# Patient Record
Sex: Female | Born: 1985 | Race: White | Hispanic: No | Marital: Single | Smoking: Current every day smoker
Health system: Southern US, Community
[De-identification: ages and names within clinical notes are randomized; demographics above are authoritative.]

## PROBLEM LIST (undated history)

## (undated) DIAGNOSIS — F603 Borderline personality disorder: Secondary | ICD-10-CM

## (undated) DIAGNOSIS — F419 Anxiety disorder, unspecified: Secondary | ICD-10-CM

## (undated) DIAGNOSIS — F4310 Post-traumatic stress disorder, unspecified: Secondary | ICD-10-CM

## (undated) DIAGNOSIS — K760 Fatty (change of) liver, not elsewhere classified: Secondary | ICD-10-CM

## (undated) DIAGNOSIS — F259 Schizoaffective disorder, unspecified: Secondary | ICD-10-CM

## (undated) DIAGNOSIS — F319 Bipolar disorder, unspecified: Secondary | ICD-10-CM

---
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
## 2004-05-03 ENCOUNTER — Inpatient Hospital Stay (HOSPITAL_COMMUNITY): Admission: AD | Admit: 2004-05-03 | Discharge: 2004-05-14 | Payer: Self-pay | Admitting: Psychiatry

## 2004-05-17 ENCOUNTER — Emergency Department (HOSPITAL_COMMUNITY): Admission: EM | Admit: 2004-05-17 | Discharge: 2004-05-17 | Payer: Self-pay | Admitting: Emergency Medicine

## 2008-03-23 ENCOUNTER — Ambulatory Visit: Payer: Self-pay | Admitting: Psychiatry

## 2008-03-23 ENCOUNTER — Inpatient Hospital Stay (HOSPITAL_COMMUNITY): Admission: AD | Admit: 2008-03-23 | Discharge: 2008-04-04 | Payer: Self-pay | Admitting: Psychiatry

## 2008-03-29 ENCOUNTER — Encounter: Payer: Self-pay | Admitting: Emergency Medicine

## 2008-04-26 ENCOUNTER — Emergency Department (HOSPITAL_COMMUNITY): Admission: EM | Admit: 2008-04-26 | Discharge: 2008-04-27 | Payer: Self-pay | Admitting: Emergency Medicine

## 2011-05-14 NOTE — H&P (Signed)
Patty Hughes, Patty Hughes               ACCOUNT NO.:  [id number]   MEDICAL RECORD NO.:  [id number]          PATIENT TYPE:  IPS   LOCATION:  0404                          FACILITY:  [facility]   PHYSICIAN:  Anselm Jungling, MD  DATE OF BIRTH:  05-Jul-1986   DATE OF ADMISSION:  03/23/2008  DATE OF DISCHARGE:                       PSYCHIATRIC ADMISSION ASSESSMENT   TIME OF ASSESSMENT:  8:25 a.m.   IDENTIFYING INFORMATION:  A 25 year old white female, single.  This is  an involuntary admission.   HISTORY OF PRESENT ILLNESS:  This is the third [facility] admission for this 89-  year-old who is referred by [facility] in [place],  [place], for psychotic and paranoid behavior.  She has apparently  been homeless for about a week and reports that she has a 54-year-old  child that was removed to the custody of Department of Social Services,  because the patient was unable to care for her.  We also had received a  report that she has been homeless and at one point was in the homeless  shelter in [facility] for about a week.  Her father was in [place],  [place] and had expressed concern about her welfare, feeling that  she was getting more and more disorganized in her behavior, and he was  concerned about her safety.  She is currently under petition by Dr.  Carolanne Grumbling at [facility].  On initial assessment,  nurses here on the unit reported that the patient appeared catatonic,  unresponsive to verbal commands, staring, and had to be lifted into a  wheelchair for her mobility.  She had a thorough medical exam and  diagnostic studies done in the emergency room, where she was medically  cleared.   She is drowsy this a.m. but responds promptly to greeting, turns her  face the way to the wall, keeps her eyes closed and offers little  information but is clearly alert.  Says that she wants to sleep, says  that she has not been able to sleep in about 3 days,  that she has a 7-  year-old daughter named [name], who is staying with a friend.  She has  been cooperative with taking the Zyprexa, says that she does not like to  take Depakote which she has had in the past but is willing to take  Zyprexa, and she had some last evening.  She denies any substance abuse.   PAST PSYCHIATRIC HISTORY:  This is the third [facility] admission for this  patient, who was last here and May 5 to the 16th, 2009 and was also on  our adolescent unit March 17 to the 29th, 2004.  Additionally, she has a  history of admission to [facility], most recently in July 2008  and a history of prior admission in [place].  She was initially  diagnosed with major depression in her teens and hospitalized at [facility], when she took an overdose of medications in the eighth  grade.  She restarted using a cannabis in the 8th grade and says that  she uses  this regularly as she can, usually about twice a week.  Current  outpatient Josean Lycan is [facility], but she has not  been keeping appointments.   SOCIAL HISTORY:  Single white female with a 38-year-old daughter,  currently homeless.  Her father lives in [place].  She is unemployed,  no known legal problems.   FAMILY HISTORY:  Is remarkable for mother who committed suicide by  overdose, when the patient was age 60.   ALCOHOL AND DRUG HISTORY:  Other substance abuse is unknown.  Her urine  drug screen is positive for cannabis.   MEDICAL HISTORY:  Primary care practitioner is not known.   MEDICAL PROBLEMS:  She denies.  She was medically cleared in the  emergency room.   MEDICATIONS:  Are none.   PAST MEDICATIONS:  Include:  1. Seroquel 50 mg b.i.d. 100 mg at bedtime.  2. Depakote ER daily, 500 mg and 1000 mg at bedtime, and she reports      she has taken Zyprexa in the past, and it has worked well for her.   DRUG ALLERGIES:  NO KNOWN DRUG ALLERGIES.  ZOLOFT HAS CAUSED NAUSEA IN  THE  PAST.   PHYSICAL EXAM:  Was done in the emergency room along with review of  systems and is noted in the record.   DIAGNOSTIC STUDIES:  Were done the emergency room at [facility]:  CBC:  WBC 10.1, hemoglobin 14.4, hematocrit 42, platelets  269,000, MCV 89.9.  Alcohol level was less than 5.  Urine drug screen  was positive for tetrahydro-cannabinoids.  Urine pregnancy test was  negative.  Chemistry:  Sodium 136, potassium 3.6, chloride 101, carbon  dioxide 23, BUN 9, creatinine 0.62, and random glucose was 117.  Liver  enzymes:  SGOT 33, SGPT 20, alkaline phosphatase 59, and total bilirubin  was 0.7.  Routine urinalysis showed moderate leukocyte esterase, clear  yellow urine with specific gravity 1.022, pH 6.5.  WBCs 3 to 6 per high-  power field and rare bacteria.   MENTAL STATUS EXAM:  This is a drowsy at 25 year old white female who  was awakened from sleep.  I do not have a formal height and weight on  her.  She appears to be medium build, slim, afebrile, pulse 108,  respirations 18, blood pressure 134/98.  She is sleepy but responds  promptly when we call her name, looks at [place] and turns away to the wall,  keeps her eyes closed.  Eye contact is poor.  Says that she just wants  to sleep, because she has not slept in 3 days.  Says she has a 2-year-  old daughter [name], although the record reflects that this is a son,  says [name] is staying with a friend.  When we ask about her homeless  situation, she says that she is trying to work that out and find a place  to live, acknowledges that she has a father who lives in [place], not  sure if she wants to go back there.  Keeps her eyes closed.  Speech is  otherwise normal, except for being minimal.  She offers little.  Mood is  neutral.  Thought process appears to be logical, giving appropriate  responses.  She does not appear internally distracted.  Her information  about the daughter is in conflict with the records  report of a 2-year-  old son.  Affect is a bit guarded.  She is denying any dangerous ideas  or auditory  hallucinations.  She is reserved but not resistant.  She is  oriented to person, place and situation and knows where she is and why.  Axis I:  Is bipolar disorder NOS, cannabis abuse.  Axis II:  Deferred.  Axis III:  No diagnosis.  Axis IV:  Severe issues with homelessness and parenting issues, with  child in DSS custody.  Axis V:  Current 32, past year not known.   PLAN:  Is to involuntarily admit the patient with q.15-minute checks in  place.  She has been willing to take Zyprexa Zydis 5 mg p.o. q.h.s., and  she is also prescribed 5 mg q.6 h. p.r.n. for psychosis or  hallucinations.  Says that she is  willing to take, that feels that it helps her.  We are going to ask the  case manager to coordinate with Department of Social Services and get  some additional information, and also the mental health liaison will see  her today.  She is prescribed a regular diet, along with Ensure  supplements as desired. Estimated length of stay is 7 days.      Margaret A. Lorin Picket, N.P.      Anselm Jungling, MD  Electronically Signed    MAS/MEDQ  D:  03/25/2008  T:  03/26/2008  Job:  [id number]

## 2011-05-17 NOTE — Discharge Summary (Signed)
Patty Hughes, Patty Hughes               ACCOUNT NO.:  [id number]   MEDICAL RECORD NO.:  [id number]          PATIENT TYPE:  IPS   LOCATION:  0400                          FACILITY:  [facility]   PHYSICIAN:  Jasmine Pang, M.D. DATE OF BIRTH:  [birth year]   DATE OF ADMISSION:  03/23/2008  DATE OF DISCHARGE:  04/04/2008                               DISCHARGE SUMMARY   IDENTIFYING INFORMATION:  This is a 25 year old single white female who  was admitted on an involuntary basis.   HISTORY OF PRESENT ILLNESS:  This is a third [facility] admission for this 73-  year-old who was referred by [facility] in [place], [place], for psychotic and paranoid behavior.  She has  apparently been homeless for about a week and reports she has a 2-year-  old child that was removed to the custody department of Social Services  because the patient was unable to care for her.  We also had received a  report that she has been homeless and at one point was in a homeless  shelter in [place] for about a week, her father was in [place],  [place] and expressed concern about her welfare.  He was feeling  she was getting more and more disorganized in her behavior, and he was  concerned about her safety.  She is currently under petition by Dr.  Carolanne Grumbling at the [facility].  On initial  assessment nurses here on the unit reported that the patient appeared to  be catatonic.  Unresponsive to verbal commands, staring, and had to be  lifted into a wheelchair for her mobility.  She had a thorough medical  exam and diagnostic studies done in the emergency room where she was  medically cleared.  She was drowsy on the a.m. of initial evaluation,  but responded promptly to greeting.  She turned her face to the wall and  kept her eyes closed and offered little information, but clearly was  alert.  She says that she wants to sleep, and she has not been able to  sleep in  about 3 days.  She states she has a 31-year-old daughter named  [name], who was staying with a friend.  She has been cooperative with  taking Zyprexa and says that she does not like to take Depakote, which  she has had in the past.  She is willing, however, to take Zyprexa, and  she had some the evening before.  She denies any substance abuse.   PAST PSYCHIATRIC HISTORY:  This is the third [facility] admission for this  patient who was last here on May 03, 2008, to May 14, 2008, and was also  on our adolescent unit on March 16, 2003 to March 28, 2003.  Additionally, she has a history of admission to [facility] most recently in July 2004 and a history of prior admission in  [place].  She was initially diagnosed with major depression in her  teens and hospitalized at [facility].  She took  an overdose of medications  in the eighth grade.  She restarted using  cannabis in the eighth grade and says that she uses this regularly when  she can, usually about twice a week.  Current outpatient Rashaun Curl is  [facility], but she has not been keeping these  appointments.   FAMILY HISTORY:  Family history is remarkable for her mother who  committed suicide by overdose when the patient was 61.   ALCOHOL AND DRUG HISTORY:  Other substance abuse is unknown.   Her urine drug screen is positive for cannabis.   MEDICAL PROBLEMS:  The patient denies.   MEDICATIONS:  None.   PAST MEDICATIONS:  Include Seroquel, Depakote, and Zyprexa.   DRUG ALLERGIES:  No known drug allergies.  Zoloft caused nausea in the  past.   PHYSICAL FINDINGS:  Physical exam was done in the emergency room and  there were no acute physical or medical problems noted.   DIAGNOSTIC STUDIES:  Diagnostic studies were done in the emergency room.  CBC was grossly within normal limits.  Alcohol level was less than 5.  Urine drug screen was positive for THC.  Urine pregnancy test  was  negative.  A chemistry profile was within normal limits except for  random glucose of 117.  Liver enzymes revealed an SGOT of 33, SGPT of  20.  Alkaline phosphatase of 59 and total bilirubin of 0.7.  Routine  urinalysis showed moderate leukocyte esterase, clear yellow urine with  specific gravity 1.022, pH 6.5.  WBCs 3 to 6 per high-power field and  rare bacteria.   HOSPITAL COURSE:  Upon admission, the patient was started on Ambien 5 mg  p.o. q.h.s. p.r.n. insomnia and Seroquel 100 mg p.o. q.h.s. and 100 mg  p.o. q.6 h p.r.n. agitation psychosis.  However, the Seroquel was  discontinued, and she was started on Zyprexa Zydis, and she has  responded well to this in the past.  She was given a 5 mg dose on March 23, 2008, and then placed on Zyprexa Zydis 5 mg p.o. q.6 h p.r.n.  psychosis or agitation.  On March 24, 2008, she was started on Zyprexa  Zydis 5 mg p.o. b.i.d. in individual sessions with the patient initially  vague responses.  Insight appeared limited.  There were no delusional  statements, but she was reserved and guarded.  She appeared to accept  the need for treatment.  On March 25, 2008, the patient stated she felt  weird on the Zyprexa.  Speech was soft and slow with fair eye contact.  There was psychomotor retardation.  She wanted to discontinue the  Zyprexa instead she was started on Depakote 500 mg p.o. q.h.s.  She felt  she was allergic to Zyprexa.  She was also started on Ativan 1 mg p.o.  q.6 h p.r.n. agitation.  She began to have less thought blocking.  She  was able to converse more easily.  She wanted a family session with her  aunt and her boyfriend.  Boyfriend was visiting and is a very  supportive.  There was still some thought disorganization.  She began to  look forward to going home as soon as possible.  On March 30, 2008,  Zyprexa Zydis was restarted 10 mg p.o. q.h.s.  The patient said, she  preferred this and was not allergic to the medication.  She  was refusing  Depakote and this was discontinued.  Her mood began to improve.  She was  feeling fine.  She was anxious  to go home as soon as possible.  She  stated she had plans to give her aunt temporary guardianship of her  daughter and medical power of attorney.  She was somewhat angry and  irritable on April 01, 2008.  She was still focused on wanting to go  home.  There was a meeting with social services on Monday April 04, 2008.  On April 04, 2008, mental status had improved, sleep good, and appetite  was good .  There was a meeting with DSS with foster mother and aunt by  conference phone.  The patient was going to apply for transitional  housing and was referred to [facility] support.  She was also referred to  [facility] in [facility].  On April 04, 2008, mental status had improved markedly from admission status.  Mood  was less depressed, less anxious, and affect consistent with mood.  No  suicidal or homicidal ideation.  No thoughts of self-injurious behavior.  No auditory or visual hallucinations.  No paranoia or delusions.  Thoughts were logical and goal-directed.  Thought content no predominant  theme.  Cognitive was grossly back to baseline and it was felt the  patient was safe to be discharged.   DISCHARGE DIAGNOSES:  Axis I:  Mood disorder, not otherwise specified,  and cannabis abuse.  Axis II:  None.  Axis III:  None.  Axis IV:  Severe (issues with homelessness and parenting issues with  child in DSS custody burden of psychiatric illness).  Axis V:  Global assessment of functioning upon discharge was 50.  GAF  upon admission was 32.  GAF highest past year was 55 to 60.   DISCHARGE PLAN:  There were no specific activity level or dietary  restrictions.   POSTHOSPITAL CARE PLANS:  The patient will be seen at the [facility], crisis emergency on April 05, 2008, at 1:30 p.m.   DISCHARGE MEDICATIONS:  Depakote 500 mg at bedtime, this  was written for  discharge but the patient was refusing to take the medication.  She was  also on Zyprexa Zydis 10 mg at bedtime, which she was agreeing to take.      Jasmine Pang, M.D.  Electronically Signed     BHS/MEDQ  D:  05/04/2008  T:  05/05/2008  Job:  [id number]

## 2011-05-17 NOTE — Discharge Summary (Signed)
NAME:  Patty Hughes, Patty Hughes                         ACCOUNT NO.:  [id number]   MEDICAL RECORD NO.:  [id number]                   PATIENT TYPE:  INP   LOCATION:  0103                                 FACILITY:  BH   PHYSICIAN:  Cindie Crumbly, M.D.               DATE OF BIRTH:  [birth year]   DATE OF ADMISSION:  03/16/2003  DATE OF DISCHARGE:  03/28/2003                                 DISCHARGE SUMMARY   REASON FOR ADMISSION:  This 25 year old white female was admitted  complaining of depression, panic attacks, anxiety and suicidal ideation with  a plan she refused to discuss.  She displayed thought-blocking and symptoms  of mania along with depression.  She also was displaying significant  psychotic symptoms and thought-blocking along with persecutory delusions.  For history of present illness, please see the patient's psychiatric  admission assessment.   PHYSICAL EXAMINATION:  At the time of admission was entirely unremarkable.   LABORATORY DATA:  The patient underwent a laboratory workup to rule out any  medical problems contributing to her symptomatology.  A hepatic panel was  within normal limits.  CBC was within normal limits.  Routine chem panel was  within normal limits.  GGT was within normal limits.  TSH was 0.657, free T4  was within normal limits.  Urine pregnancy test was negative.  A urine drug  screen was positive for metabolites of marijuana and was otherwise  unremarkable.  UA was unremarkable.  RPR was nonreactive.  Urine probe for  gonorrhea and chlamydia were negative.  The patient received no x-rays, no  special procedures, no additional consultations.  She sustained no  complications during the course of this hospitalization.   HOSPITAL COURSE:  The patient was begun on a trial of Depakote and Zyprexa.  She tolerated these medications well without side effects.  On admission,  the patient was psychomotor agitated, responding to internal stimuli, showed  significant thought-blocking as well as persecutory delusions and her  thoughts were disorganized.  Her speech was coherent, pressured with  significant flight of ideas.  Her affect and mood were depressed, anxious  and labile.  She tolerated her medication well without side effects and  gradually showed improvement over the course of this hospitalization.  At  the time of discharge, she is no longer displaying any evidence of a thought  disorder.  Her speech is less pressured.  Her thought racing has been  resolving.  She is able to perform her activities of daily living.  Her  impulse control has improved as has her affect and mood.  She denies any  suicidal or homicidal ideation, is actively participating in all aspects of  the therapeutic treatment program and, consequently, is felt to have reached  her maximum benefits of hospitalization and is ready for discharge to a less  restrictive alternative setting.  On admission dose of 1000 mg of Depakote  ER, the  patient showed a trough of 50.2 and her medication was increased to  1500 mg p.o. q.h.s.  The results of a valproic acid level, at steady state  for that dose, are pending at the time of discharge.   CONDITION ON DISCHARGE:  Improved.   DIAGNOSES (ACCORDING TO DSM-IV):   AXIS I:  Bipolar disorder, mixed-type, severe with psychosis.   AXIS II:  Rule out learning disorder not otherwise specified.   AXIS III:  None.   AXIS IV:  Severe.   AXIS V:  20 on admission; 30 on discharge.   FURTHER EVALUATION AND TREATMENT RECOMMENDATIONS:  1. The patient is discharged to home.  2. She is discharged on an unrestricted level of activity and a regular     diet.  3. She will follow up with Dr. Milford Cage, her outpatient psychiatrist,     for all further aspects of her psychiatric care and, consequently, I will     sign off on the case at this time.  She will follow up with her primary     care physician for all further aspects of  her medical care.  4. She is discharged on Zyprexa 20 mg p.o. q.h.s., Depakote 1500 mg p.o.     q.h.s.                                               Cindie Crumbly, M.D.    TS/MEDQ  D:  03/28/2003  T:  03/28/2003  Job:  [id number]

## 2011-05-17 NOTE — H&P (Signed)
NAME:  Patty Hughes, Patty Hughes NO.:  [id number]   MEDICAL RECORD NO.:  [id number]                   PATIENT TYPE:  IPS   LOCATION:  0300                                 FACILITY:  BH   PHYSICIAN:  Jeanice Lim, M.D.              DATE OF BIRTH:  12-04-1986   DATE OF ADMISSION:  05/03/2004  DATE OF DISCHARGE:                         PSYCHIATRIC ADMISSION ASSESSMENT   IDENTIFYING INFORMATION:  The patient is a 25 year old single white female  involuntarily committed on May 03, 2004.   HISTORY OF PRESENT ILLNESS:  The patient presents with a history of panic  attacks, reporting PTSD symptoms.  The patient reports she has been doing  substances, doing cocaine, both powder and crack.  She reports she has not  been sleeping for the past two days.  She has been noncompliant with her  medications.  She was feeling very afraid and anxious.  She was also having  positive auditory hallucinations telling her that she is worthless.  The  patient reports that she has been having multiple sexual partners.  The  patient has been having suicidal thoughts but is unable to state a plan.  She has been noncompliant with medications as stated.   PAST PSYCHIATRIC HISTORY:  This is the second hospitalization.  The patient  was here on the child/adolescent unit when she was about 25 years of age,  sponsored by [facility].  She has a history of  bipolar disorder.  No prior suicide attempt.  She used to see Cindie Crumbly, M.D., in the past.   SUBSTANCE ABUSE HISTORY:  The patient smokes.  She reports some alcohol for  the past few months.  She has been using cocaine.   PAST MEDICAL HISTORY:  Primary care Keyari Kleeman: Dr. Marlowe Aschoff.  Medical  problems: None.   MEDICATIONS:  1. Zoloft.  2. Depakote.  3. Risperdal.   DRUG ALLERGIES:  The patient reports ZOLOFT makes her nauseated.   PHYSICAL EXAMINATION:  GENERAL:  Physical examination was  performed.  This  is a young female, well nourished, anxious appearing, possibly responding to  internal stimuli.  NEUROLOGIC:  Findings are intact.  VITAL SIGNS:  Temperature 97.7, heart rate 87, respirations 20, blood  pressure 139/87.   LABORATORY DATA:  CBC is within normal limits.  Potassium 3.2.  Urine  pregnancy test was negative.  TSH was 1.930.  Urine drug screen was positive  for marijuana, positive for benzodiazepines.  Valproic acid level was 19.3.  Ketones were greater than 80, wbc's on urine was 3-6.  The patient's GC and  Chlamydia on urine were both negative as well as her HIV.   SOCIAL HISTORY:  This is a 25 year old single white female.  She lives with  her father.  The patient states she is planning to go to [facility].  She  has some traffic tickets in the works.   FAMILY  HISTORY:  She states her mother overdosed and died when the patient  was 25 years of age.   MENTAL STATUS EXAM:  She is an alert, young female, cooperative, fair eye  contact.  Speech is clear.  There is some thought blocking.  The patient  states she feels very tired and out of it.  She appears anxious.  Thought  processes are scattered.  She is distracted, possibly responding to internal  stimuli, also possibly a little paranoid.  As the patient was to have her  physical examination, she was concerned that I did not want get close to  her.  Cognitive functioning: Intact.  Judgment is impaired.  Insight is  limited.  Decreased focusing.  She is a poor historian.  Memory is poor.   ADMISSION DIAGNOSES:   AXIS I:  Bipolar disorder with psychotic features.   AXIS II:  Deferred.   AXIS III:  None.   AXIS IV:  Severe related to other psychosocial problems.   AXIS V:  Current is 25, this past year is 60.   INITIAL PLAN OF CARE:  Plan is a voluntary admission to [facility] for psychotic symptoms, suicidal ideation.  Contract for safety.  Stabilize mood and thinking.  Will  check the patient for STDs.  Will resume  her medications.  Will check Depakote levels for therapeutic range.  The  patient is to increase coping skills.  The patient will continue to  reinforce medication compliance and followup.  Will have a family session  with the father.  The patient is to follow up with mental health.   ESTIMATED LENGTH OF STAY:  Five to six days or more depending on the  patient's response to medications.     Landry Corporal, N.P.                       Jeanice Lim, M.D.    JO/MEDQ  D:  05/09/2004  T:  05/09/2004  Job:  [id number]

## 2011-05-17 NOTE — Discharge Summary (Signed)
NAME:  Patty Hughes, Patty Hughes NO.:  [id number]   MEDICAL RECORD NO.:  [id number]                   PATIENT TYPE:  IPS   LOCATION:  0302                                 [facility]ILITY[facility]  [facility]   PHYSICIAN:  Jeanice Lim, M.D.              DATE OF BIRTH:  11/19/1986   DATE OF ADMISSION:  05/03/2004  DATE OF DISCHARGE:  05/14/2004                                 DISCHARGE SUMMARY   IDENTIFYING DATA:  This is a 25 year old single Caucasian female  involuntarily committed.  She presented with a history of panic attacks.  She reported PTSD symptoms.  She had been using substances including  cocaine, both powder and crack.  She had not been sleeping for two days,  noncompliant with medications, feeling afraid, anxious, experiencing  auditory hallucinations telling her that she was worthless, having multiple  sexual partners, performing with others to be able to be given drugs,  feeling out of control, fearful, and paranoid at the time of admission.   PAST PSYCHIATRIC HISTORY:  The patient was on the Child/Adolescent Unit a  year ago.  History of bipolar disorder.  Used to see Cindie Crumbly, M.D.,  in the past.   MEDICATIONS:  1. Zoloft.  2. Depakote.  3. Risperdal.   The patient reported that Zoloft caused nausea.   DRUG ALLERGIES:  No other drug allergies.   PHYSICAL EXAMINATION:  GENERAL:  Within normal limits.  NEUROLOGIC:  Nonfocal.   LABORATORY DATA:  Routine admission labs:  Essentially within normal limits.  Potassium slightly low at 3.2.  Urine pregnancy test was negative.  TSH 1.9.  Urine drug screen was positive for marijuana and benzodiazepines.  Valproic  acid level was low at 19.3.  GC and Chlamydia as well as HIV were negative.   FAMILY HISTORY:  Family history was significant for mother who overdosed and  died when the patient was 25 years old.   MENTAL STATUS EXAM:  Alert, young female, cooperative, fair eye contact.  Speech: Clear, some  thought blocking.  The patient reported feeling tired  and out of it, appearing anxious.  Thought process: Scattered, distracted;  possibly responding to internal stimulus versus continuing to thought block,  clearly paranoid, fearful that others were talking about her and when she  would hear noises, felt that others were saying things under their breath  about her.  Cognitive: Intact.  Judgment and insight: Impaired with  decreased focusing.  Also poor historian due to degree of disorder.   ADMISSION DIAGNOSES:   AXIS I:  1. Bipolar disorder with psychotic features.  2. Rule out schizoaffective disorder.  3. 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  4. Cocaine abuse.  5. Benzodiazepine abuse.   AXIS II:  Deferred.   AXIS III:  None.   AXIS IV:  Severe related to psychosocial issues, limited support, and  sequelae and lifestyle of substance use.   AXIS  V:  25/60   HOSPITAL COURSE:  The patient was admitted, ordered routine p.r.n.  medications, underwent further monitoring, and was encouraged to participate  in individual, group, and milieu therapy.  The patient was monitored  medically as well as evaluated for STDs.  Depakote level was monitored.  The  patient was resumed on medications, placed on safety checks, encouraged to  participate in individual, group, and milieu therapy as well as specifically  substance abuse treatment.  The patient required p.r.n. medications for  agitation and paranoia that were significant.  There was a significant  degree of psychosis including paranoid, delusional, and hallucinations for  the first several days of admission.  These gradually improved as she was  optimized on medications including Risperdal, Depakote, and Zoloft.  Zoloft  was then discontinued due to the patient's report of it causing nausea.  The  patient was changed to Paxil.  The patient was further optimized on  medications.  Klonopin was added for  significant degree of acute anxiety.  The patient changed status to voluntary, appeared to have increased insight  regarding the importance of being stabilized on medications and being  compliant with medications and being abstinent from substances of abuse.  She showed gradual improvement with further clinical intervention.   CONDITION ON DISCHARGE:  The patient was discharged in markedly improved  condition with no dangerous ideation.  Mood was euthymic.  Affect: Brighter.  No overt psychotic symptoms, no command hallucinations, and motivation to be  compliant with the aftercare plan as well as abstinent.  She was given  medication education.   DISCHARGE MEDICATIONS:  1. Paxil 10 mg q.a.m.  2. Depakote ER 500 mg two q.h.s.  3. Symmetrel 100 mg b.i.d.  4. Klonopin 0.5 mg one half q.a.m. and one q.h.s.  5. Risperdal 0.5 mg one half q.a.m., 2 p.m., and 2 mg at bedtime.  6. Zyprexa 15 mg q.h.s.   FOLLOW UP:  The patient was to discharged to follow up on May 18 with  Eber Jones with the recommendation for a referral to [facility] and [facility] for medication management and individual therapy as  well as substance abuse treatment.   DISCHARGE DIAGNOSES:   AXIS I:  1. Bipolar disorder with psychotic features.  2. Rule out schizoaffective disorder.  3. 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  4. Cocaine abuse.  5. Benzodiazepine abuse.   AXIS II:  Deferred.   AXIS III:  None.   AXIS IV:  Severe related to psychosocial issues, limited support, and  sequelae and lifestyle of substance use.   AXIS V:  Global assessment of functioning on discharge was 55.                                               Jeanice Lim, M.D.    JEM/MEDQ  D:  06/12/2004  T:  06/12/2004  Job:  [id number]

## 2011-05-17 NOTE — H&P (Signed)
NAME:  Patty Hughes, Patty Hughes                         ACCOUNT NO.:  [id number]   MEDICAL RECORD NO.:  [id number]                   PATIENT TYPE:  INP   LOCATION:  0103                                 [facility]ACILITY:  [facility]   PHYSICIAN:  Cindie Crumbly, M.D.               DATE OF BIRTH:  [birth year]   DATE OF ADMISSION:  03/16/2003  DATE OF DISCHARGE:                         PSYCHIATRIC ADMISSION ASSESSMENT   REASON FOR ADMISSION:  This 25 year old white female was admitted  complaining of depression, panic attacks, anxiety and suicidal ideation with  a plan she was unable to discuss.  She repeated I'm a danger to myself,  and was unable to contract for safety.   HISTORY OF PRESENT ILLNESS:  The patient admits to increasingly racing  thoughts, decreased need for sleep, flight of ideas, persecutory delusions,  ideas of reference, increased distractibility, thought blocking, psychomotor  agitation.  She has had increasingly disorganized thoughts, responding to  internal stimuli, with decreased school performance and reports that she is  afraid she will harm herself.  She is unable to specify a specific suicidal  plan but appears profoundly disorganized.   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  She was hospitalized following an overdose in the 8th  grade at [facility]  of [place].  She has no other inpatient  hospitalization.   DRUG AND ALCOHOL ABUSE HISTORY:  She reports that she has been using  cannabis at least 1 time per week for the past year and she started her  cannabis use in the 8th grade.  She states that her last use was 3 days ago.  She reports smoking a half a pack of cigarettes per day for the past year.  She denies any other street drug use.  She denies any use of alcohol.   PAST MEDICAL HISTORY:  She denies any medical or surgical problems.  She has  no known drug allergies or sensitivities.   CURRENT  MEDICATIONS:  Lexapro 10 mg p.o. daily which she has taken since  October of 1993.   STRENGTHS AND ASSETS:  Her father is supportive of her.   FAMILY AND SOCIAL HISTORY:  The patient lives with her father.  Mother had a  history of bipolar disorder and died 2 years ago of a cardiac arrest on  route from the emergency department to [facility].  The patient is currently in the 12th grade.   MENTAL STATUS EXAM:  The patient presents as a well-developed, well-  nourished adolescent white female, who is alert, oriented x4, psychomotor  agitated, and whose appearance is compatible with her stated age.  She is  responding to internal stimuli.  She displays significant thought blocking,  admits to persecutory delusions and ideas of reference.  Her thoughts are  disorganized.  Her affect and mood are depressed, anxious, labile and  irritable.  Her immediate recall, short term memory and remote memory appear  to be grossly intact.  Similarities are within normal limits.  She is unable  to do differences or abstract the proverbs at the present time.   ADMISSION DIAGNOSES:   AXIS I:  1. Bipolar disorder, mixed type, severe, with psychosis.  2. Rule out schizoaffective disorder.   AXIS II:  Rule out learning disorder not otherwise specified.   AXIS III:  None.   AXIS IV:  Severe.   AXIS V:  Code 20.   FURTHER EVALUATION AND TREATMENT RECOMMENDATIONS:  1. Estimated length of stay for the patient on the inpatient unit is 5 to 7     days.  2. Initial discharge plan is to discharge the patient to home.  3. Initial plan of care is to discontinue Lexapro and begin the patient on a     trial of Zyprexa for agitation and Depakote ER for mood stabilization.     Psychotherapy will focus on improving the patient's impulse control,     decreasing cognitive distortions and potential for self harm.  A     laboratory workup will also be initiated to rule out any other  medical     problems contributing to her symptomatology.  I have discussed with the     patient and her father the risks, benefits, side effects and alternatives     including the alternative of using no medication and they have agreed to     the above trial of medications.   \                                               Cindie Crumbly, M.D.    TS/MEDQ  D:  03/17/2003  T:  03/17/2003  Job:  [id number]

## 2011-09-23 LAB — POCT CARDIAC MARKERS: Operator id: 4295

## 2011-09-23 LAB — D-DIMER, QUANTITATIVE: D-Dimer, Quant: <0.22

## 2011-09-24 LAB — DIFFERENTIAL
Basophils Absolute: 0
Basophils Relative: 0
Eosinophils Absolute: 0
Eosinophils Relative: 0
Lymphocytes Relative: 36
Monocytes Absolute: 0.5

## 2011-09-24 LAB — BASIC METABOLIC PANEL
BUN: 9
CO2: 30
Chloride: 103
Glucose, Bld: 93
Potassium: 3.4 — ABNORMAL LOW
Sodium: 138

## 2011-09-24 LAB — CBC
HCT: 37.4
Hemoglobin: 12.9
MCHC: 34.6
MCV: 90.1
RDW: 12.7

## 2011-09-24 LAB — URINALYSIS, ROUTINE W REFLEX MICROSCOPIC
Bilirubin Urine: NEGATIVE
Ketones, ur: NEGATIVE
Nitrite: NEGATIVE
Protein, ur: NEGATIVE

## 2011-09-24 LAB — RAPID URINE DRUG SCREEN, HOSP PERFORMED
Amphetamines: NOT DETECTED
Benzodiazepines: NOT DETECTED
Cocaine: NOT DETECTED

## 2011-09-24 LAB — PREGNANCY, URINE: Preg Test, Ur: NEGATIVE

## 2012-03-07 ENCOUNTER — Emergency Department (HOSPITAL_COMMUNITY)
Admission: EM | Admit: 2012-03-07 | Discharge: 2012-03-07 | Discharge status: Against Medical Advice | Payer: PRIVATE HEALTH INSURANCE | Attending: Emergency Medicine | Admitting: Emergency Medicine

## 2012-03-07 DIAGNOSIS — F411 Generalized anxiety disorder: Secondary | ICD-10-CM | POA: Insufficient documentation

## 2015-09-06 ENCOUNTER — Ambulatory Visit (INDEPENDENT_AMBULATORY_CARE_PROVIDER_SITE_OTHER): Payer: Medicare Other | Admitting: Podiatry

## 2015-09-06 ENCOUNTER — Encounter: Payer: Self-pay | Admitting: Podiatry

## 2015-09-06 VITALS — BP 117/76 | HR 90 | Ht 64.0 in | Wt 210.0 lb

## 2015-09-06 DIAGNOSIS — M7660 Achilles tendinitis, unspecified leg: Secondary | ICD-10-CM | POA: Diagnosis not present

## 2015-09-06 DIAGNOSIS — M216X9 Other acquired deformities of unspecified foot: Secondary | ICD-10-CM

## 2015-09-06 DIAGNOSIS — M21969 Unspecified acquired deformity of unspecified lower leg: Secondary | ICD-10-CM

## 2015-09-06 DIAGNOSIS — M7662 Achilles tendinitis, left leg: Secondary | ICD-10-CM

## 2015-09-06 DIAGNOSIS — M7661 Achilles tendinitis, right leg: Secondary | ICD-10-CM

## 2015-09-06 NOTE — Progress Notes (Signed)
SUBJECTIVE: 29 y.o. year old female presents complaining of pain in back of both heels off and on for several months.   REVIEW OF SYSTEMS: A comprehensive review of systems was negative. Positive of high cholesterol level and fatty liver in her last visit.  OBJECTIVE: DERMATOLOGIC EXAMINATION: Nails: normal appearing nails bilaterally Calluses: Thick broad callus under 5th MPJ area and plantar lateral aspect of the heels bilateral.  VASCULAR EXAMINATION OF LOWER LIMBS: Pedal pulses: All pedal pulses are palpable with normal pulsation.  Capillary Filling times within 3 seconds in all digits.  Temperature gradient from tibial crest to dorsum of foot is within normal bilateral. NEUROLOGIC EXAMINATION OF THE LOWER LIMBS: Achilles DTR is present and within normal. Sharp and Dull discriminatory sensations at the plantar ball of hallux is intact bilateral.  MUSCULOSKELETAL EXAMINATION: Positive for high arched cavus type foot with tight Achilles tendon bilateral R>L. Excess sagittal plane motion of the first ray bilateral R>L. Supinated STJ bilateral.   RADIOGRAPHIC STUDIES:  Positive of high arched cavus foot in dorsally elevated first ray bilateral. Normal posterior heel bone.  Supinated Subtalar joint bilateral. No other acute changes noted.   ASSESSMENT: Bilateral Achilles tendonitis with Ankle equinus bilateral. Forefoot varus with elevated first ray. Cavus type foot with plantar callus under 5th MPJ and lateral heel.   PLAN: Reviewed clinical findings and available treatment options. Reviewed Achilles tendon stretch exercise. Reviewed proper shoe gear and benefit of custom orthotics. Patient will return to prepare for orthotics.

## 2015-09-06 NOTE — Patient Instructions (Signed)
Seen for pain in Achilles tendon. Noted of tight tendon and weakened bone position. Need to do daily stretch exercise and wear custom orthotics. We will call with insurance info.

## 2016-05-30 ENCOUNTER — Emergency Department (HOSPITAL_BASED_OUTPATIENT_CLINIC_OR_DEPARTMENT_OTHER)
Admission: EM | Admit: 2016-05-30 | Discharge: 2016-05-30 | Disposition: A | Discharge status: Home / Self Care | Payer: Medicare Other | Attending: Emergency Medicine | Admitting: Emergency Medicine

## 2016-05-30 ENCOUNTER — Encounter (HOSPITAL_BASED_OUTPATIENT_CLINIC_OR_DEPARTMENT_OTHER): Payer: Self-pay

## 2016-05-30 ENCOUNTER — Emergency Department (HOSPITAL_BASED_OUTPATIENT_CLINIC_OR_DEPARTMENT_OTHER): Payer: Medicare Other

## 2016-05-30 DIAGNOSIS — Z79899 Other long term (current) drug therapy: Secondary | ICD-10-CM | POA: Diagnosis not present

## 2016-05-30 DIAGNOSIS — F17200 Nicotine dependence, unspecified, uncomplicated: Secondary | ICD-10-CM | POA: Insufficient documentation

## 2016-05-30 DIAGNOSIS — F319 Bipolar disorder, unspecified: Secondary | ICD-10-CM | POA: Insufficient documentation

## 2016-05-30 DIAGNOSIS — R109 Unspecified abdominal pain: Secondary | ICD-10-CM | POA: Diagnosis present

## 2016-05-30 DIAGNOSIS — N390 Urinary tract infection, site not specified: Secondary | ICD-10-CM | POA: Diagnosis not present

## 2016-05-30 HISTORY — DX: Bipolar disorder, unspecified: F31.9

## 2016-05-30 HISTORY — DX: Anxiety disorder, unspecified: F41.9

## 2016-05-30 HISTORY — DX: Schizoaffective disorder, unspecified: F25.9

## 2016-05-30 LAB — URINE MICROSCOPIC-ADD ON: RBC / HPF: NONE SEEN RBC/hpf (ref 0–5)

## 2016-05-30 LAB — URINALYSIS, ROUTINE W REFLEX MICROSCOPIC
BILIRUBIN URINE: NEGATIVE
GLUCOSE, UA: NEGATIVE mg/dL
HGB URINE DIPSTICK: NEGATIVE
KETONES UR: NEGATIVE mg/dL
Nitrite: NEGATIVE
PH: 7 (ref 5.0–8.0)
Protein, ur: NEGATIVE mg/dL
Specific Gravity, Urine: 1.025 (ref 1.005–1.030)

## 2016-05-30 LAB — PREGNANCY, URINE: Preg Test, Ur: NEGATIVE

## 2016-05-30 MED ORDER — FOSFOMYCIN TROMETHAMINE 3 G PO PACK
3.0000 g | PACK | Freq: Once | ORAL | Status: AC
Start: 2016-05-30 — End: 2016-05-30
  Administered 2016-05-30: 3 g via ORAL
  Filled 2016-05-30: qty 3

## 2016-05-30 NOTE — ED Notes (Signed)
Pt verbalizes understanding of d/c instructions and denies any further needs at this time. 

## 2016-05-30 NOTE — ED Provider Notes (Signed)
CSN: [id number]     Arrival date & time 05/30/16  0109 History   First MD Initiated Contact with Patient 05/30/16 0128     Chief Complaint  Patient presents with  . Abdominal Pain     (Consider location/radiation/quality/duration/timing/severity/associated sxs/prior Treatment) HPI  This is a 30 year old female with a history of bipolar disorder and anxiety. She was told sometime in the past, though she cannot remember when, that she had a fatty liver. She was reading about this condition and read that it was "the silent killer" and has become concerned that something is seriously wrong with her. She complains of intermittent abdominal pain which is vaguely characterized, is diffuse, is intermittent and of moderate intensity. The pain is not present currently. The pain is worse when she smokes. She states it has been going on "for several weeks, I think, I haven't really been paying attention". She also complains of changes in her stools, "it's like diarrhea but not really diarrhea" and again is having difficulty stating how long this is been going on. She denies fever, nausea, vomiting, dysuria, vaginal bleeding or vaginal discharge.  Past Medical History  Diagnosis Date  . Bipolar 1 disorder (HCC)   . Anxiety   . Schizoaffective disorder (HCC)    History reviewed. No pertinent past surgical history. No family history on file. Social History  Substance Use Topics  . Smoking status: Current Every Day Smoker  . Smokeless tobacco: Never Used  . Alcohol Use: None   OB History    No data available     Review of Systems  All other systems reviewed and are negative.   Allergies  Review of patient's allergies indicates no known allergies.  Home Medications   Prior to Admission medications   Medication Sig Start Date End Date Taking? Authorizing Provider  FANAPT 6 MG TABS  08/03/15  Yes Historical Provider, MD  LORazepam (ATIVAN) 1 MG tablet  08/01/15  Yes Historical Provider, MD   NUVARING 0.12-0.015 MG/24HR vaginal ring  08/30/15  Yes Historical Provider, MD  PRISTIQ 50 MG 24 hr tablet  08/01/15  Yes Historical Provider, MD  zolpidem (AMBIEN) 10 MG tablet Take 20 mg by mouth at bedtime as needed for sleep.   Yes Historical Provider, MD  BELSOMRA 10 MG TABS  08/09/15   Historical Provider, MD  nitrofurantoin (MACRODANTIN) 100 MG capsule  06/07/15   Historical Provider, MD   BP 122/75 mmHg  Pulse 87  Temp(Src) 98.6 F (37 C) (Oral)  Resp 16  Ht 5\' 5"  (1.651 m)  Wt 210 lb (95.255 kg)  BMI 34.95 kg/m2  SpO2 99%  LMP 05/15/2016 (Approximate)   Physical Exam  General: Well-developed, well-nourished female in no acute distress; appearance consistent with age of record HENT: normocephalic; atraumatic Eyes: pupils equal, round and reactive to light; extraocular muscles intact Neck: supple Heart: regular rate and rhythm Lungs: clear to auscultation bilaterally Abdomen: soft; nondistended; nontender; no masses or hepatosplenomegaly; bowel sounds present Extremities: No deformity; full range of motion; pulses normal Neurologic: Awake, alert and oriented; motor function intact in all extremities and symmetric; no facial droop Skin: Warm and dry Psychiatric: Normal mood and affect    ED Course  Procedures (including critical care time)   MDM  Nursing notes and vitals signs, including pulse oximetry, reviewed.  Summary of this visit's results, reviewed by myself:  Labs:  Results for orders placed or performed during the hospital encounter of 05/30/16 (from the past 24 hour(s))  Urinalysis, Routine  w reflex microscopic (not at [facility])     Status: Abnormal   Collection Time: 05/30/16  1:35 AM  Result Value Ref Range   Color, Urine YELLOW YELLOW   APPearance CLEAR CLEAR   Specific Gravity, Urine 1.025 1.005 - 1.030   pH 7.0 5.0 - 8.0   Glucose, UA NEGATIVE NEGATIVE mg/dL   Hgb urine dipstick NEGATIVE NEGATIVE   Bilirubin Urine NEGATIVE NEGATIVE   Ketones, ur  NEGATIVE NEGATIVE mg/dL   Protein, ur NEGATIVE NEGATIVE mg/dL   Nitrite NEGATIVE NEGATIVE   Leukocytes, UA MODERATE (A) NEGATIVE  Pregnancy, urine     Status: None   Collection Time: 05/30/16  1:35 AM  Result Value Ref Range   Preg Test, Ur NEGATIVE NEGATIVE  Urine microscopic-add on     Status: Abnormal   Collection Time: 05/30/16  1:35 AM  Result Value Ref Range   Squamous Epithelial / LPF 0-5 (A) NONE SEEN   WBC, UA 6-30 0 - 5 WBC/hpf   RBC / HPF NONE SEEN 0 - 5 RBC/hpf   Bacteria, UA FEW (A) NONE SEEN    Imaging Studies: Dg Abd 1 View  05/30/2016  CLINICAL DATA:  Abdominal pain since last night. EXAM: ABDOMEN - 1 VIEW COMPARISON:  None. FINDINGS: Normal bowel gas pattern. Moderate stool burden. Ingested debris distends the stomach. No small bowel dilatation. No radiopaque calculi or abnormal mass effect. Osseous structures are normal. IMPRESSION: Normal abdominal radiograph. Electronically Signed   By: Rubye Oaks M.D.   On: 05/30/2016 02:30       Paula Libra, MD 05/30/16 [phone]

## 2016-05-30 NOTE — ED Notes (Signed)
Pt states she wants to be "checked," bc she was dx'd with a fatty liver at one point and has been reading on the internet what that means.  She is not currently having any symptoms and cannot pin point why she needed to come into tonight.

## 2016-05-30 NOTE — ED Notes (Signed)
MD at bedside. 

## 2016-05-31 LAB — URINE CULTURE

## 2016-09-16 DIAGNOSIS — Z79899 Other long term (current) drug therapy: Secondary | ICD-10-CM | POA: Insufficient documentation

## 2016-09-16 DIAGNOSIS — L02213 Cutaneous abscess of chest wall: Secondary | ICD-10-CM | POA: Insufficient documentation

## 2016-09-16 DIAGNOSIS — F17210 Nicotine dependence, cigarettes, uncomplicated: Secondary | ICD-10-CM | POA: Diagnosis not present

## 2016-09-17 ENCOUNTER — Encounter (HOSPITAL_BASED_OUTPATIENT_CLINIC_OR_DEPARTMENT_OTHER): Payer: Self-pay | Admitting: Emergency Medicine

## 2016-09-17 ENCOUNTER — Emergency Department (HOSPITAL_BASED_OUTPATIENT_CLINIC_OR_DEPARTMENT_OTHER)
Admission: EM | Admit: 2016-09-17 | Discharge: 2016-09-17 | Disposition: A | Discharge status: Home / Self Care | Payer: Medicare Other | Attending: Emergency Medicine | Admitting: Emergency Medicine

## 2016-09-17 DIAGNOSIS — L0291 Cutaneous abscess, unspecified: Secondary | ICD-10-CM

## 2016-09-17 MED ORDER — SULFAMETHOXAZOLE-TRIMETHOPRIM 800-160 MG PO TABS: 1.0000 | ORAL_TABLET | Freq: Every day | ORAL | 0 refills | Status: DC

## 2016-09-17 MED ORDER — LIDOCAINE HCL (PF) 1 % IJ SOLN
5.0000 mL | Freq: Once | INTRAMUSCULAR | Status: AC
  Administered 2016-09-17: 5 mL
  Filled 2016-09-17: qty 5

## 2016-09-17 NOTE — ED Triage Notes (Signed)
Pimple-like lesion between breasts with white head.  Pt sts it has been there for 3 months and is hurting more.

## 2016-09-17 NOTE — ED Provider Notes (Signed)
[facility] Provider Note   CSN: [id number] Arrival date & time: 09/16/16  2353  History   Chief Complaint Chief Complaint  Patient presents with  . Abscess    HPI Patty Hughes is a 30 y.o. female.  HPI  30 y.o. female presents to the Emergency Department today complaining of abscess in between breasts. Notes hx of similar abscesses throughout body that she popped and resolved. Notes popping this one and pain is still presents. Pt notes that it has been there for 3 months and that her pain is 4/10. No fevers. No N/V. Has not tried warm compresses. Pt adamantly wants pus removed from site. No other symptoms noted  Past Medical History:  Diagnosis Date  . Anxiety   . Bipolar 1 disorder (HCC)   . Schizoaffective disorder [facility](HCC)     Patient Active Problem List   Diagnosis Date Noted  . Achilles tendonitis, bilateral 09/06/2015  . Equinus deformity of foot, acquired 09/06/2015  . Metatarsal deformity 09/06/2015    History reviewed. No pertinent surgical history.  OB History    No data available       Home Medications    Prior to Admission medications   Medication Sig Start Date End Date Taking? Authorizing Provider  clonazePAM (KLONOPIN) 1 MG tablet Take 1 mg by mouth 3 (three) times daily as needed for anxiety.   Yes Historical Provider, MD  eszopiclone (LUNESTA) 1 MG TABS tablet Take 3 mg by mouth at bedtime as needed for sleep. Take immediately before bedtime   Yes Historical Provider, MD  BELSOMRA 10 MG TABS  08/09/15   Historical Provider, MD  FANAPT 6 MG TABS  08/03/15   Historical Provider, MD  LORazepam (ATIVAN) 1 MG tablet  08/01/15   Historical Provider, MD  nitrofurantoin (MACRODANTIN) 100 MG capsule  06/07/15   Historical Provider, MD  NUVARING 0.12-0.015 MG/24HR vaginal ring  08/30/15   Historical Provider, MD  PRISTIQ 50 MG 24 hr tablet  08/01/15   Historical Provider, MD  zolpidem (AMBIEN) 10 MG tablet Take 20 mg by mouth at bedtime as needed for  sleep.    Historical Provider, MD    Family History No family history on file.  Social History Social History  Substance Use Topics  . Smoking status: Current Every Day Smoker    Packs/day: 1.00    Types: Cigarettes  . Smokeless tobacco: Never Used  . Alcohol use No     Allergies   Haldol [haloperidol]; Risperidone and related; and Lamictal [lamotrigine]   Review of Systems Review of Systems  Constitutional: Negative for fever.  Cardiovascular: Negative for chest pain.  Gastrointestinal: Negative for diarrhea, nausea and vomiting.   Physical Exam Updated Vital Signs BP 127/78 (BP Location: Right Arm)   Pulse 89   Temp 98.8 F (37.1 C) (Oral)   Resp 16   Ht 5\' 4"  (1.626 m)   Wt 97.1 kg   LMP 08/28/2016 (Approximate)   SpO2 100%   BMI 36.73 kg/m   Physical Exam  Constitutional: She is oriented to person, place, and time. Vital signs are normal. She appears well-developed and well-nourished.  HENT:  Head: Normocephalic.  Right Ear: Hearing normal.  Left Ear: Hearing normal.  Eyes: Conjunctivae and EOM are normal. Pupils are equal, round, and reactive to light.  Neck: Normal range of motion. Neck supple.  Cardiovascular: Normal rate, regular rhythm, normal heart sounds and intact distal pulses.   Pulmonary/Chest: Effort normal and breath sounds normal.  Neurological: She is alert and oriented to person, place, and time.  Skin: Skin is warm and dry.  2cm are of induration between bilateral breasts on sternum. No erythema. No purulence. TTP on exam.   Psychiatric: She has a normal mood and affect. Her speech is normal and behavior is normal. Thought content normal.  Nursing note and vitals reviewed.  ED Treatments / Results  Labs (all labs ordered are listed, but only abnormal results are displayed) Labs Reviewed - No data to display  EKG  EKG Interpretation None      Radiology No results found.  Procedures .Marland KitchenIncision and Drainage Date/Time:  09/17/2016 12:52 AM Performed by: Audry Pili Authorized by: Audry Pili   Consent:    Consent obtained:  Verbal   Consent given by:  Patient   Risks discussed:  Pain, incomplete drainage, bleeding, damage to other organs and infection   Alternatives discussed:  Alternative treatment Location:    Type:  Abscess   Location:  Trunk   Trunk location:  Chest Pre-procedure details:    Skin preparation:  Betadine Procedure type:    Complexity:  Simple Procedure details:    Incision types:  Stab incision and single straight   Incision depth:  Dermal   Scalpel blade:  11   Wound management:  Probed and deloculated and irrigated with saline   Drainage:  Serosanguinous   Drainage amount:  Scant   Wound treatment:  Wound left open   Packing materials:  1/2 in gauze Post-procedure details:    Patient tolerance of procedure:  Tolerated well, no immediate complications   (including critical care time)  Medications Ordered in ED Medications  lidocaine (PF) (XYLOCAINE) 1 % injection 5 mL (not administered)   Initial Impression / Assessment and Plan / ED Course  I have reviewed the triage vital signs and the nursing notes.  Pertinent labs & imaging results that were available during my care of the patient were reviewed by me and considered in my medical decision making (see chart for details).  Clinical Course    Final Clinical Impressions(s) / ED Diagnoses  I have reviewed the relevant previous healthcare records. I obtained HPI from historian.  ED Course:  Assessment: Pt is a 30yF who presents to the Emergency Department with skin abscess amenable to incision and drainage. No signs of cellulitis surrounding skin. Given Rx Abx, Will d/c to home.  Disposition/Plan:  DC Home Additional Verbal discharge instructions given and discussed with patient.  Pt Instructed to f/u with PCP in the next week for evaluation and treatment of symptoms. Return precautions given Pt acknowledges and  agrees with plan  Supervising Physician Shon Baton, MD   Final diagnoses:  Abscess    New Prescriptions New Prescriptions   No medications on file     Audry Pili, PA-C 09/17/16 0109    Shon Baton, MD 09/17/16 [phone]

## 2016-09-17 NOTE — Discharge Instructions (Signed)
Please read and follow all provided instructions.  Your diagnoses today include:  1. Abscess     Tests performed today include: Vital signs. See below for your results today.   Medications prescribed:   Take any prescribed medications only as directed.   Home care instructions:  Follow any educational materials contained in this packet  Follow-up instructions: Return to the Emergency Department in 48 hours for a recheck if your symptoms are not significantly improved.  Please follow-up with your primary care provider in the next 1 week for further evaluation of your symptoms.   Return instructions:  Return to the Emergency Department if you have: Fever Worsening symptoms Worsening pain Worsening swelling Redness of the skin that moves away from the affected area, especially if it streaks away from the affected area  Any other emergent concerns  Additional Information: If you have recurrent abscesses, try both the following. Use a Qtip to apply an over-the-counter antibiotic to the inside of your nostrils, twice a day for 5 days. Wash your body with over-the-counter Hibaclens once a day for one week and then once every two weeks. This can reduce the amount of bacterial on your skin that causes boils and lead to fewer boils. If you continue to have multiple or recurrent boils, you should see a dermatologist (skin doctor).   Your vital signs today were: BP 127/78 (BP Location: Right Arm)    Pulse 89    Temp 98.8 F (37.1 C) (Oral)    Resp 16    Ht 5\' 4"  (1.626 m)    Wt 97.1 kg    LMP 08/28/2016 (Approximate)    SpO2 100%    BMI 36.73 kg/m  If your blood pressure (BP) was elevated above 135/85 this visit, please have this repeated by your doctor within one month. --------------

## 2017-01-10 ENCOUNTER — Emergency Department (HOSPITAL_BASED_OUTPATIENT_CLINIC_OR_DEPARTMENT_OTHER)
Admission: EM | Admit: 2017-01-10 | Discharge: 2017-01-10 | Disposition: A | Discharge status: Home / Self Care | Payer: Medicare HMO | Attending: Emergency Medicine | Admitting: Emergency Medicine

## 2017-01-10 ENCOUNTER — Emergency Department (HOSPITAL_BASED_OUTPATIENT_CLINIC_OR_DEPARTMENT_OTHER): Payer: Medicare HMO

## 2017-01-10 ENCOUNTER — Encounter (HOSPITAL_BASED_OUTPATIENT_CLINIC_OR_DEPARTMENT_OTHER): Payer: Self-pay | Admitting: *Deleted

## 2017-01-10 DIAGNOSIS — F17210 Nicotine dependence, cigarettes, uncomplicated: Secondary | ICD-10-CM | POA: Insufficient documentation

## 2017-01-10 DIAGNOSIS — J069 Acute upper respiratory infection, unspecified: Secondary | ICD-10-CM | POA: Diagnosis not present

## 2017-01-10 DIAGNOSIS — Z79899 Other long term (current) drug therapy: Secondary | ICD-10-CM | POA: Insufficient documentation

## 2017-01-10 DIAGNOSIS — B9789 Other viral agents as the cause of diseases classified elsewhere: Secondary | ICD-10-CM

## 2017-01-10 DIAGNOSIS — R05 Cough: Secondary | ICD-10-CM | POA: Insufficient documentation

## 2017-01-10 MED ORDER — BENZONATATE 100 MG PO CAPS: 200.0000 mg | ORAL_CAPSULE | Freq: Two times a day (BID) | ORAL | 0 refills | Status: DC | PRN

## 2017-01-10 MED ORDER — GUAIFENESIN ER 600 MG PO TB12: 1200.0000 mg | ORAL_TABLET | Freq: Two times a day (BID) | ORAL | 0 refills | Status: DC

## 2017-01-10 NOTE — Discharge Instructions (Signed)
You appear to have an upper respiratory infection (URI). An upper respiratory tract infection, or cold, is a viral infection of the air passages leading to the lungs. It is contagious and can be spread to others, especially during the first 3 or 4 days. It cannot be cured by antibiotics or other medicines. °RETURN IMMEDIATELY IF you develop shortness of breath, confusion or altered mental status, a new rash, become dizzy, faint, or poorly responsive, or are unable to be cared for at home. ° °

## 2017-01-10 NOTE — ED Provider Notes (Signed)
[facility] Provider Note   CSN: [id number] Arrival date & time: 01/10/17  1916  By signing my name below, I, [name], attest that this documentation has been prepared under the direction and in the presence of Arthor Captain, PA-C. Electronically Signed: [name], Scribe. 01/10/2017. 9:34 PM.  History   Chief Complaint Chief Complaint  Patient presents with  . Cough    The history is provided by the patient. No language interpreter was used.     HPI Comments: Patty Hughes is a 31 y.o. female who presents to the Emergency Department complaining of a persistent dry cough beginning yesterday. She notes associated generalized body aches, fever (tmax 100), chills, sore throat, and intermittent headaches. She states her cough has occasionally been productive. She notes her boyfriend is sick with similar symptoms. Pt reports she cannot take many OTC medications because of potentially harmful interaction with her regular psychiatric medications. She denies nausea, vomiting, or any other associated symptoms.  Past Medical History:  Diagnosis Date  . Anxiety   . Bipolar 1 disorder (HCC)   . Schizoaffective disorder [facility])     Patient Active Problem List   Diagnosis Date Noted  . Achilles tendonitis, bilateral 09/06/2015  . Equinus deformity of foot, acquired 09/06/2015  . Metatarsal deformity 09/06/2015    History reviewed. No pertinent surgical history.  OB History    No data available       Home Medications    Prior to Admission medications   Medication Sig Start Date End Date Taking? Authorizing Provider  clonazePAM (KLONOPIN) 1 MG tablet Take 1 mg by mouth 3 (three) times daily as needed for anxiety.   Yes Historical Provider, MD  eszopiclone (LUNESTA) 1 MG TABS tablet Take 3 mg by mouth at bedtime as needed for sleep. Take immediately before bedtime   Yes Historical Provider, MD  FANAPT 6 MG TABS  08/03/15  Yes Historical Provider, MD  NUVARING  0.12-0.015 MG/24HR vaginal ring  08/30/15  Yes Historical Provider, MD  PRISTIQ 50 MG 24 hr tablet  08/01/15  Yes Historical Provider, MD  BELSOMRA 10 MG TABS  08/09/15   Historical Provider, MD  LORazepam (ATIVAN) 1 MG tablet  08/01/15   Historical Provider, MD  nitrofurantoin (MACRODANTIN) 100 MG capsule  06/07/15   Historical Provider, MD  sulfamethoxazole-trimethoprim (BACTRIM DS,SEPTRA DS) 800-160 MG tablet Take 1 tablet by mouth daily. 09/17/16   Audry Pili, PA-C  zolpidem (AMBIEN) 10 MG tablet Take 20 mg by mouth at bedtime as needed for sleep.    Historical Provider, MD    Family History No family history on file.  Social History Social History  Substance Use Topics  . Smoking status: Current Every Day Smoker    Packs/day: 1.00    Types: Cigarettes  . Smokeless tobacco: Never Used  . Alcohol use No     Allergies   Haldol [haloperidol]; Risperidone and related; and Lamictal [lamotrigine]   Review of Systems Review of Systems  Constitutional: Positive for chills and fever.  HENT: Positive for sore throat.   Respiratory: Positive for cough.   Gastrointestinal: Negative for nausea and vomiting.  Musculoskeletal: Positive for myalgias (generalized).  Neurological: Positive for headaches.     Physical Exam Updated Vital Signs BP 121/83 (BP Location: Right Arm)   Pulse (!) 123   Temp 99 F (37.2 C) (Oral) Comment: ibuprofen at 1730 per pt  Resp 20   Ht 5\' 5"  (1.651 m)   Wt 225 lb (102.1  kg)   LMP 12/29/2016   SpO2 98%   BMI 37.44 kg/m   Physical Exam  Constitutional: She is oriented to person, place, and time. She appears well-developed and well-nourished. No distress.  HENT:  Head: Normocephalic and atraumatic.  Mouth/Throat: Posterior oropharyngeal erythema present. No tonsillar exudate.  Eyes: Conjunctivae and EOM are normal.  Neck: Neck supple. No tracheal deviation present.  Cardiovascular: Normal rate.   Pulmonary/Chest: Effort normal. No respiratory  distress.  Musculoskeletal: Normal range of motion.  Neurological: She is alert and oriented to person, place, and time.  Skin: Skin is warm and dry.  Psychiatric: She has a normal mood and affect. Her behavior is normal.  Nursing note and vitals reviewed.    ED Treatments / Results  Labs (all labs ordered are listed, but only abnormal results are displayed) Labs Reviewed - No data to display  EKG  EKG Interpretation None       Radiology Dg Chest 2 View  Result Date: 01/10/2017 CLINICAL DATA:  Cough congestion EXAM: CHEST  2 VIEW COMPARISON:  12 20 2015 FINDINGS: The heart size and mediastinal contours are within normal limits. Both lungs are clear. The visualized skeletal structures are unremarkable. IMPRESSION: No active cardiopulmonary disease. Electronically Signed   By: Jasmine PangKim  Fujinaga M.D.   On: 01/10/2017 21:21    Procedures Procedures (including critical care time)  DIAGNOSTIC STUDIES: Oxygen Saturation is 98% on RA, normal by my interpretation.    COORDINATION OF CARE: 9:39 PM Discussed treatment plan with pt at bedside and pt agreed to plan.  Medications Ordered in ED Medications - No data to display   Initial Impression / Assessment and Plan / ED Course  I have reviewed the triage vital signs and the nursing notes.  Pertinent labs & imaging results that were available during my care of the patient were reviewed by me and considered in my medical decision making (see chart for details).  Clinical Course     Pt CXR negative for acute infiltrate. Patients symptoms are consistent with URI, likely viral etiology. Discussed that antibiotics are not indicated for viral infections. Pt will be discharged with symptomatic treatment.  Verbalizes understanding and is agreeable with plan. Pt is hemodynamically stable & in NAD prior to dc.   Final Clinical Impressions(s) / ED Diagnoses   Final diagnoses:  None    New Prescriptions New Prescriptions   No  medications on file   I personally performed the services described in this documentation, which was scribed in my presence. The recorded information has been reviewed and is accurate.        Arthor Captainbigail Cederic Mozley, PA-C 01/11/17 0018    Maia PlanJoshua G Long, MD 01/11/17 1050

## 2017-01-10 NOTE — ED Triage Notes (Signed)
Cough since yesterdays. Bodyaches.

## 2017-02-20 ENCOUNTER — Emergency Department (HOSPITAL_BASED_OUTPATIENT_CLINIC_OR_DEPARTMENT_OTHER)
Admission: EM | Admit: 2017-02-20 | Discharge: 2017-02-21 | Disposition: A | Discharge status: Home / Self Care | Payer: Medicare PPO | Attending: Emergency Medicine | Admitting: Emergency Medicine

## 2017-02-20 ENCOUNTER — Encounter (HOSPITAL_BASED_OUTPATIENT_CLINIC_OR_DEPARTMENT_OTHER): Payer: Self-pay | Admitting: Emergency Medicine

## 2017-02-20 DIAGNOSIS — K0889 Other specified disorders of teeth and supporting structures: Secondary | ICD-10-CM

## 2017-02-20 DIAGNOSIS — Z79899 Other long term (current) drug therapy: Secondary | ICD-10-CM | POA: Diagnosis not present

## 2017-02-20 DIAGNOSIS — F17210 Nicotine dependence, cigarettes, uncomplicated: Secondary | ICD-10-CM | POA: Insufficient documentation

## 2017-02-20 NOTE — ED Provider Notes (Signed)
[facility]   CSN: [id number] Arrival date & time: 02/20/17  2222  By signing my name below, I, [name], attest that this documentation has been prepared under the direction and in the presence of Amra Shukla, [place]. Electronically Signed: [name], Scribe. 02/20/2017. 11:54 PM.  History   Chief Complaint Chief Complaint  Patient presents with  . Dental Pain    The history is provided by the patient. No language interpreter was used.     HPI Comments: Patty Hughes is a 31 y.o. female who presents to the Emergency Department complaining of intermittent, gradually worsening right lower dental pain for a couple of days. Pt reports associated right-sided facial swelling and pain radiation up the right side of her face. She states she experienced the same pain earlier this month and was evaluated by her dentist; she has a cavity in one of her right lower teeth and was prescribed amoxicillin, but missed several doses of the antibiotic. Pt has tried Orajel with minimal improvement of her dental pain as well as Advil with moderate improvement of her pain. She has an appointment with her dentist on 3/1 but wanted to be evaluated tonight because she is concerned for infection. She denies fever, chills, nausea, vomiting, constipation, or any other associated symptoms.  Past Medical History:  Diagnosis Date  . Anxiety   . Bipolar 1 disorder (HCC)   . Schizoaffective disorder [facility])     Patient Active Problem List   Diagnosis Date Noted  . Achilles tendonitis, bilateral 09/06/2015  . Equinus deformity of foot, acquired 09/06/2015  . Metatarsal deformity 09/06/2015    History reviewed. No pertinent surgical history.  OB History    No data available       Home Medications    Prior to Admission medications   Medication Sig Start Date End Date Taking? Authorizing Provider  amoxicillin-clavulanate (AUGMENTIN) 875-125 MG tablet Take 1 tablet by  mouth 2 (two) times daily. 02/21/17 02/28/17  Shaunessy Dobratz [address] [place], [place]  BELSOMRA 10 MG TABS  08/09/15   Historical Provider, MD  benzonatate (TESSALON) 100 MG capsule Take 2 capsules (200 mg total) by mouth 2 (two) times daily as needed for cough. 01/10/17   Arthor Captain, PA-C  clonazePAM (KLONOPIN) 1 MG tablet Take 1 mg by mouth 3 (three) times daily as needed for anxiety.    Historical Provider, MD  eszopiclone (LUNESTA) 1 MG TABS tablet Take 3 mg by mouth at bedtime as needed for sleep. Take immediately before bedtime    Historical Provider, MD  FANAPT 6 MG TABS  08/03/15   Historical Provider, MD  guaiFENesin (MUCINEX) 600 MG 12 hr tablet Take 2 tablets (1,200 mg total) by mouth 2 (two) times daily. 01/10/17   Arthor Captain, PA-C  LORazepam (ATIVAN) 1 MG tablet  08/01/15   Historical Provider, MD  nitrofurantoin (MACRODANTIN) 100 MG capsule  06/07/15   Historical Provider, MD  NUVARING 0.12-0.015 MG/24HR vaginal ring  08/30/15   Historical Provider, MD  PRISTIQ 50 MG 24 hr tablet  08/01/15   Historical Provider, MD  sulfamethoxazole-trimethoprim (BACTRIM DS,SEPTRA DS) 800-160 MG tablet Take 1 tablet by mouth daily. 09/17/16   Audry Pili, PA-C  zolpidem (AMBIEN) 10 MG tablet Take 20 mg by mouth at bedtime as needed for sleep.    Historical Provider, MD    Family History History reviewed. No pertinent family history.  Social History Social History  Substance Use Topics  . Smoking status: Current Every Day  Smoker    Packs/day: 1.00    Types: Cigarettes  . Smokeless tobacco: Never Used  . Alcohol use No     Allergies   Haldol [haloperidol]; Risperidone and related; and Lamictal [lamotrigine]   Review of Systems Review of Systems  Constitutional: Negative for chills and fever.  HENT: Positive for dental problem and facial swelling.   Gastrointestinal: Negative for constipation, nausea and vomiting.     Physical Exam Updated Vital Signs BP 118/78 (BP Location: Left Arm)   Pulse 97    Temp 98.9 F (37.2 C) (Oral)   Resp 18   Ht 5\' 5"  (1.651 m)   Wt 104.3 kg   LMP 01/30/2017   SpO2 100%   BMI 38.27 kg/m   Physical Exam  Constitutional: She is oriented to person, place, and time. She appears well-developed and well-nourished. No distress.  Well-appearing  HENT:  Head: Normocephalic and atraumatic.  Tooth #30: no surrounding redness or swelling. No TTP of the gums.  Eyes: Conjunctivae and EOM are normal.  Neck: Normal range of motion. Neck supple. No tracheal deviation present.  Patient has no evidence of swelling, redness, tenderness to palpation of the neck, lymphadenopathy.  Cardiovascular: Normal rate.   Pulmonary/Chest: Effort normal. No respiratory distress.  Musculoskeletal: Normal range of motion.  Lymphadenopathy:    She has no cervical adenopathy.  Neurological: She is alert and oriented to person, place, and time.  Skin: Skin is warm and dry.  Psychiatric: She has a normal mood and affect. Her behavior is normal.  Nursing note and vitals reviewed.    ED Treatments / Results  Labs (all labs ordered are listed, but only abnormal results are displayed) Labs Reviewed - No data to display  EKG  EKG Interpretation None       Radiology No results found.  Procedures Procedures (including critical care time)  DIAGNOSTIC STUDIES: Oxygen Saturation is 100% on RA, normal by my interpretation.    COORDINATION OF CARE: 12:03 AM Discussed treatment plan with pt at bedside and pt agreed to plan.  Medications Ordered in ED Medications - No data to display   Initial Impression / Assessment and Plan / ED Course  I have reviewed the triage vital signs and the nursing notes.  Pertinent labs & imaging results that were available during my care of the patient were reviewed by me and considered in my medical decision making (see chart for details).    Patient with dentalgia. No abscess requiring immediate incision and drainage.  Exam not concerning  for Ludwig's angina or pharyngeal abscess.  Will treat with Augmentin. Pt instructed to follow-up with dentist.  Patient also given a dental resource guide. Discussed return precautions. Pt safe for discharge. Reasons to immediately return to the emergency department discussed.   Final Clinical Impressions(s) / ED Diagnoses   Final diagnoses:  Pain, dental    New Prescriptions New Prescriptions   AMOXICILLIN-CLAVULANATE (AUGMENTIN) 875-125 MG TABLET    Take 1 tablet by mouth 2 (two) times daily.   I personally performed the services described in this documentation, which was scribed in my presence. The recorded information has been reviewed and is accurate.   [address]Madisen Ludvigsen Manuel DanforthEsp[place], [place] 02/21/17 0036    Benjiman CoreNathan Pickering, MD 02/22/17 [phone]

## 2017-02-20 NOTE — ED Triage Notes (Signed)
Patient has had tooth pain to her right jaw starting yesterday - reports that 2 weeks ago she was seen at her dentist for the same.

## 2017-02-21 MED ORDER — AMOXICILLIN-POT CLAVULANATE 875-125 MG PO TABS: 1.0000 | ORAL_TABLET | Freq: Two times a day (BID) | ORAL | 0 refills | Status: AC

## 2017-02-21 NOTE — Discharge Instructions (Signed)
Take Augmentin 2 times a day for 7 days. Use warm compress to the area and on her cheek and warm salt water rinses. Please follow-up with your dentist wearing today's visit. Dental resource guard also  Contact a health care provider if: You continue to have tooth pain after a tooth injury. Your tooth is sensitive to heat and cold. You develop swelling near your injured tooth. You have a fever. You are unable to open your jaw. You are drooling and it is getting worse.

## 2017-02-21 NOTE — ED Notes (Signed)
Pt verbalizes understanding of d/c instructions and denies any further needs at this time. 

## 2017-07-12 ENCOUNTER — Encounter (HOSPITAL_BASED_OUTPATIENT_CLINIC_OR_DEPARTMENT_OTHER): Payer: Self-pay

## 2017-07-12 ENCOUNTER — Emergency Department (HOSPITAL_BASED_OUTPATIENT_CLINIC_OR_DEPARTMENT_OTHER)
Admission: EM | Admit: 2017-07-12 | Discharge: 2017-07-12 | Disposition: A | Discharge status: Home / Self Care | Payer: Medicare PPO | Attending: Emergency Medicine | Admitting: Emergency Medicine

## 2017-07-12 DIAGNOSIS — F17210 Nicotine dependence, cigarettes, uncomplicated: Secondary | ICD-10-CM | POA: Insufficient documentation

## 2017-07-12 DIAGNOSIS — F319 Bipolar disorder, unspecified: Secondary | ICD-10-CM | POA: Insufficient documentation

## 2017-07-12 DIAGNOSIS — F259 Schizoaffective disorder, unspecified: Secondary | ICD-10-CM | POA: Diagnosis not present

## 2017-07-12 DIAGNOSIS — T50901A Poisoning by unspecified drugs, medicaments and biological substances, accidental (unintentional), initial encounter: Secondary | ICD-10-CM | POA: Diagnosis not present

## 2017-07-12 DIAGNOSIS — R5383 Other fatigue: Secondary | ICD-10-CM | POA: Diagnosis present

## 2017-07-12 HISTORY — DX: Fatty (change of) liver, not elsewhere classified: K76.0

## 2017-07-12 LAB — COMPREHENSIVE METABOLIC PANEL
ALT: 30 U/L (ref 14–54)
AST: 23 U/L (ref 15–41)
Albumin: 3.8 g/dL (ref 3.5–5.0)
Alkaline Phosphatase: 72 U/L (ref 38–126)
Anion gap: 10 (ref 5–15)
BUN: 7 mg/dL (ref 6–20)
CHLORIDE: 102 mmol/L (ref 101–111)
CO2: 25 mmol/L (ref 22–32)
Calcium: 8.9 mg/dL (ref 8.9–10.3)
Creatinine, Ser: 0.99 mg/dL (ref 0.44–1.00)
GFR calc Af Amer: >60 mL/min (ref >60)
Glucose, Bld: 77 mg/dL (ref 65–99)
POTASSIUM: 3.3 mmol/L — AB (ref 3.5–5.1)
Sodium: 137 mmol/L (ref 135–145)
Total Bilirubin: 0.4 mg/dL (ref 0.3–1.2)
Total Protein: 7.2 g/dL (ref 6.5–8.1)

## 2017-07-12 LAB — RAPID URINE DRUG SCREEN, HOSP PERFORMED
AMPHETAMINES: NOT DETECTED
BENZODIAZEPINES: NOT DETECTED
Barbiturates: NOT DETECTED
Cocaine: NOT DETECTED
OPIATES: NOT DETECTED
Tetrahydrocannabinol: NOT DETECTED

## 2017-07-12 LAB — CBC
HCT: 39.4 % (ref 36.0–46.0)
Hemoglobin: 13.8 g/dL (ref 12.0–15.0)
MCH: 30.2 pg (ref 26.0–34.0)
MCHC: 35 g/dL (ref 30.0–36.0)
MCV: 86.2 fL (ref 78.0–100.0)
PLATELETS: 205 10*3/uL (ref 150–400)
RBC: 4.57 MIL/uL (ref 3.87–5.11)
RDW: 13 % (ref 11.5–15.5)
WBC: 12.1 10*3/uL — ABNORMAL HIGH (ref 4.0–10.5)

## 2017-07-12 LAB — ACETAMINOPHEN LEVEL

## 2017-07-12 LAB — ETHANOL

## 2017-07-12 LAB — SALICYLATE LEVEL

## 2017-07-12 NOTE — ED Provider Notes (Addendum)
[facility] Provider Note: Patty Dell, MD, FACEP  CSN: [id number] MRN: [id number] ARRIVAL: 07/12/17 at 0041 ROOM: MH10/MH10   CHIEF COMPLAINT  Drug Overdose   HISTORY OF PRESENT ILLNESS  Patty Hughes is a 31 y.o. female with a long-standing psychiatric history. She arrived by EMS after taking a total of 6 Klonopin tablets, last dose about 9:30 or 10:30 yesterday evening. She normally takes for Klonopin tablets daily but states she got confused. She states she did not take her evening dose of Seroquel. She states she is having lethargy, dry mouth, slurred speech and difficulty with muscle coordination. She had nausea and diarrhea earlier but denies nausea presently. She is still having some mild abdominal discomfort. She is also complaining of a headache and cold feet.   Past Medical History:  Diagnosis Date  . Anxiety   . Bipolar 1 disorder (HCC)   . Fatty liver   . Schizoaffective disorder (HCC)     History reviewed. No pertinent surgical history.  No family history on file.  Social History  Substance Use Topics  . Smoking status: Current Every Day Smoker    Packs/day: 1.50    Types: Cigarettes  . Smokeless tobacco: Never Used  . Alcohol use 0.0 oz/week     Comment: "sometimes, not much"    Prior to Admission medications   Medication Sig Start Date End Date Taking? Authorizing Provider  DULoxetine (CYMBALTA) 30 MG capsule Take 30 mg by mouth daily.   Yes [provider]  OXcarbazepine (TRILEPTAL) 150 MG tablet Take 150 mg by mouth 2 (two) times daily.   Yes [provider]  QUEtiapine (SEROQUEL) 50 MG tablet Take 50 mg by mouth at bedtime.   Yes [provider]  BELSOMRA 10 MG TABS  08/09/15   [provider]  benzonatate (TESSALON) 100 MG capsule Take 2 capsules (200 mg total) by mouth 2 (two) times daily as needed for cough. 01/10/17   Harris, Cammy Copa, PA-C  clonazePAM (KLONOPIN) 1 MG tablet Take 1 mg by mouth 3  (three) times daily as needed for anxiety.    [provider]  eszopiclone (LUNESTA) 1 MG TABS tablet Take 3 mg by mouth at bedtime as needed for sleep. Take immediately before bedtime    [provider]  FANAPT 6 MG TABS  08/03/15   [provider]  guaiFENesin (MUCINEX) 600 MG 12 hr tablet Take 2 tablets (1,200 mg total) by mouth 2 (two) times daily. 01/10/17   Arthor Captain, PA-C  LORazepam (ATIVAN) 1 MG tablet  08/01/15   [provider]  nitrofurantoin (MACRODANTIN) 100 MG capsule  06/07/15   [provider]  NUVARING 0.12-0.015 MG/24HR vaginal ring  08/30/15   [provider]  PRISTIQ 50 MG 24 hr tablet  08/01/15   [provider]  sulfamethoxazole-trimethoprim (BACTRIM DS,SEPTRA DS) 800-160 MG tablet Take 1 tablet by mouth daily. 09/17/16   Audry Pili, PA-C  zolpidem (AMBIEN) 10 MG tablet Take 20 mg by mouth at bedtime as needed for sleep.    [provider]    Allergies Haldol [haloperidol]; Risperidone and related; and Lamictal [lamotrigine]   REVIEW OF SYSTEMS  Negative except as noted here or in the History of Present Illness.   PHYSICAL EXAMINATION  Initial Vital Signs Blood pressure 130/81, pulse 89, temperature 98.1 F (36.7 C), temperature source Oral, resp. rate 18, height 5\' 5"  (1.651 m), weight 106.6 kg (235 lb), last menstrual period 06/25/2017, SpO2 100 %.  Examination General: Well-developed, well-nourished female in no acute distress; appearance consistent with age of record HENT: normocephalic; atraumatic Eyes: pupils equal, round and reactive to light Neck: supple Heart: regular rate and rhythm Lungs: clear to auscultation bilaterally Abdomen: soft; nondistended; nontender; bowel sounds present Extremities: No deformity; full range of motion; pulses normal Neurologic: Somnolent but arousable; dysarthria; ataxia; motor function intact in all extremities and symmetric; no facial droop Skin: Warm  and dry Psychiatric: No HI; no SI   RESULTS  Summary of this visit's results, reviewed by myself:   EKG Interpretation  Date/Time:  Saturday July 12 2017 00:59:27 EDT Ventricular Rate:  79 PR Interval:    QRS Duration: 82 QT Interval:  375 QTC Calculation: 430 R Axis:   44 Text Interpretation:  Sinus rhythm Normal ECG No previous ECGs available Confirmed by Kayliana Codd, Jonny Ruiz ([id number]) on 07/12/2017 1:02:25 AM      Laboratory Studies: Results for orders placed or performed during the hospital encounter of 07/12/17 (from the past 24 hour(s))  Comprehensive metabolic panel     Status: Abnormal   Collection Time: 07/12/17 12:52 AM  Result Value Ref Range   Sodium 137 135 - 145 mmol/L   Potassium 3.3 (L) 3.5 - 5.1 mmol/L   Chloride 102 101 - 111 mmol/L   CO2 25 22 - 32 mmol/L   Glucose, Bld 77 65 - 99 mg/dL   BUN 7 6 - 20 mg/dL   Creatinine, Ser 6.04 0.44 - 1.00 mg/dL   Calcium 8.9 8.9 - 54.0 mg/dL   Total Protein 7.2 6.5 - 8.1 g/dL   Albumin 3.8 3.5 - 5.0 g/dL   AST 23 15 - 41 U/L   ALT 30 14 - 54 U/L   Alkaline Phosphatase 72 38 - 126 U/L   Total Bilirubin 0.4 0.3 - 1.2 mg/dL   GFR calc non Af Amer >60 >60 mL/min   GFR calc Af Amer >60 >60 mL/min   Anion gap 10 5 - 15  Ethanol     Status: None   Collection Time: 07/12/17 12:52 AM  Result Value Ref Range   Alcohol, Ethyl (B) <5 <5 mg/dL  Salicylate level     Status: None   Collection Time: 07/12/17 12:52 AM  Result Value Ref Range   Salicylate Lvl <7.0 2.8 - 30.0 mg/dL  Acetaminophen level     Status: Abnormal   Collection Time: 07/12/17 12:52 AM  Result Value Ref Range   Acetaminophen (Tylenol), Serum <10 (L) 10 - 30 ug/mL  cbc     Status: Abnormal   Collection Time: 07/12/17 12:52 AM  Result Value Ref Range   WBC 12.1 (H) 4.0 - 10.5 K/uL   RBC 4.57 3.87 - 5.11 MIL/uL   Hemoglobin 13.8 12.0 - 15.0 g/dL   HCT 98.1 19.1 - 47.8 %   MCV 86.2 78.0 - 100.0 fL   MCH 30.2 26.0 - 34.0 pg   MCHC 35.0 30.0 - 36.0 g/dL   RDW  29.5 62.1 - 30.8 %   Platelets 205 150 - 400 K/uL  Rapid urine drug screen (hospital performed)     Status: None   Collection Time: 07/12/17 12:53 AM  Result Value Ref Range   Opiates NONE DETECTED NONE DETECTED   Cocaine NONE DETECTED NONE DETECTED   Benzodiazepines NONE DETECTED NONE DETECTED   Amphetamines NONE DETECTED NONE DETECTED   Tetrahydrocannabinol NONE DETECTED NONE DETECTED   Barbiturates NONE DETECTED NONE DETECTED   Imaging Studies: No results found.  ED COURSE  Nursing notes and initial vitals signs, including pulse oximetry, reviewed.  Vitals:   07/12/17 0330 07/12/17 0400 07/12/17 0430 07/12/17 0500  BP: 108/75 109/71 111/67 108/71  Pulse: 88 86 86 80  Resp: (!) 23 19 (!) 22 20  Temp:      TempSrc:      SpO2: 94% 94% 95% 95%  Weight:      Height:       6:23 AM Sleeping but readily arousable. States she feels better. She states she can call her boyfriend for right home.  PROCEDURES    ED DIAGNOSES     ICD-10-CM   1. Drug overdose, accidental or unintentional, initial encounter T50.901A        Elyon Zoll, Jonny RuizJohn, MD 07/12/17 [id number]    Paula LibraMolpus, Osman Calzadilla, MD 07/12/17 [phone]

## 2017-07-12 NOTE — ED Triage Notes (Signed)
Pt arrived via [facility]. Pt states she took 1mg  Klonopin x6 between 21:30-22:30 this evening. Pt states she took a nap between the times and forgot that she took them. Pt reports that now she is having dry mouth, slurred speech, HA, cold feet, muscle stiffness, and diarrhea.

## 2018-02-13 ENCOUNTER — Other Ambulatory Visit: Payer: Self-pay

## 2018-02-13 ENCOUNTER — Encounter (HOSPITAL_BASED_OUTPATIENT_CLINIC_OR_DEPARTMENT_OTHER): Payer: Self-pay | Admitting: *Deleted

## 2018-02-13 ENCOUNTER — Emergency Department (HOSPITAL_BASED_OUTPATIENT_CLINIC_OR_DEPARTMENT_OTHER): Payer: Medicare PPO

## 2018-02-13 DIAGNOSIS — Z79899 Other long term (current) drug therapy: Secondary | ICD-10-CM | POA: Diagnosis not present

## 2018-02-13 DIAGNOSIS — B349 Viral infection, unspecified: Secondary | ICD-10-CM | POA: Insufficient documentation

## 2018-02-13 DIAGNOSIS — F17210 Nicotine dependence, cigarettes, uncomplicated: Secondary | ICD-10-CM | POA: Insufficient documentation

## 2018-02-13 DIAGNOSIS — R05 Cough: Secondary | ICD-10-CM | POA: Diagnosis present

## 2018-02-13 NOTE — ED Triage Notes (Addendum)
Cough, HA, chest pressure when coughing, 'hoarse' voice x 1 day. States productive cough with yellow sputum.  States that she also has a sore throat and an 'empty feeling in her chest' with palpation.

## 2018-02-14 ENCOUNTER — Emergency Department (HOSPITAL_BASED_OUTPATIENT_CLINIC_OR_DEPARTMENT_OTHER)
Admission: EM | Admit: 2018-02-14 | Discharge: 2018-02-14 | Disposition: A | Discharge status: Home / Self Care | Payer: Medicare PPO | Attending: Emergency Medicine | Admitting: Emergency Medicine

## 2018-02-14 DIAGNOSIS — B349 Viral infection, unspecified: Secondary | ICD-10-CM

## 2018-02-14 HISTORY — DX: Borderline personality disorder: F60.3

## 2018-02-14 HISTORY — DX: Post-traumatic stress disorder, unspecified: F43.10

## 2018-02-14 NOTE — ED Provider Notes (Signed)
[facility] EMERGENCY DEPARTMENT Provider Note   CSN: [id number] Arrival date & time: 02/13/18  2131     History   Chief Complaint Chief Complaint  Patient presents with  . Influenza    HPI Patty Hughes is a 32 y.o. female.  Chief complaint is cough and body aches  HPI 32 year old female.  States she had body aches and cough for the last few days.  Was hurting "all over" took some Motrin and Tylenol at home.  States she arrives feeling "much better.  No fever.  Occasional cough.  Feels "burning in my lungs".  Fluids a vaccine.  No chronic heart or lung disease.  Past Medical History:  Diagnosis Date  . Anxiety   . Bipolar 1 disorder (HCC)   . Borderline personality disorder (HCC)   . Fatty liver   . PTSD (post-traumatic stress disorder)   . Schizoaffective disorder [facility])     Patient Active Problem List   Diagnosis Date Noted  . Achilles tendonitis, bilateral 09/06/2015  . Equinus deformity of foot, acquired 09/06/2015  . Metatarsal deformity 09/06/2015    History reviewed. No pertinent surgical history.  OB History    No data available       Home Medications    Prior to Admission medications   Medication Sig Start Date End Date Taking? Authorizing Provider  clonazePAM (KLONOPIN) 1 MG tablet Take 1 mg by mouth 3 (three) times daily as needed for anxiety.    [provider]  DULoxetine (CYMBALTA) 30 MG capsule Take 30 mg by mouth daily.    [provider]  FANAPT 6 MG TABS  08/03/15   [provider]  NUVARING 0.12-0.015 MG/24HR vaginal ring  08/30/15   [provider]  QUEtiapine (SEROQUEL) 50 MG tablet Take 50 mg by mouth at bedtime.    [provider]    Family History History reviewed. No pertinent family history.  Social History Social History   Tobacco Use  . Smoking status: Current Every Day Smoker    Packs/day: 1.50    Types: Cigarettes  . Smokeless tobacco: Never Used  Substance Use  Topics  . Alcohol use: Yes    Alcohol/week: 0.0 oz    Comment: "sometimes, not much"  . Drug use: No     Allergies   Haldol [haloperidol]; Risperidone and related; and Lamictal [lamotrigine]   Review of Systems Review of Systems  Constitutional: Positive for fatigue. Negative for appetite change, chills, diaphoresis and fever.  HENT: Negative for mouth sores, sore throat and trouble swallowing.   Eyes: Negative for visual disturbance.  Respiratory: Positive for cough. Negative for chest tightness, shortness of breath and wheezing.   Cardiovascular: Negative for chest pain.  Gastrointestinal: Negative for abdominal distention, abdominal pain, diarrhea, nausea and vomiting.  Endocrine: Negative for polydipsia, polyphagia and polyuria.  Genitourinary: Negative for dysuria, frequency and hematuria.  Musculoskeletal: Positive for myalgias. Negative for gait problem.  Skin: Negative for color change, pallor and rash.  Neurological: Negative for dizziness, syncope, light-headedness and headaches.  Hematological: Does not bruise/bleed easily.  Psychiatric/Behavioral: Negative for behavioral problems and confusion.     Physical Exam Updated Vital Signs BP 123/70 (BP Location: Right Arm)   Pulse 100   Temp 99.4 F (37.4 C) (Oral)   Resp 20   Ht 5\' 4"  (1.626 m)   Wt 113.4 kg (250 lb)   LMP 01/30/2018   SpO2 96%   BMI 42.91 kg/m   Physical Exam  Constitutional:  She is oriented to person, place, and time. She appears well-developed and well-nourished. No distress.  HENT:  Head: Normocephalic.  Normal pharynx.  Eyes: Conjunctivae are normal. Pupils are equal, round, and reactive to light. No scleral icterus.  No conjunctival injection.  Neck: Normal range of motion. Neck supple. No thyromegaly present.  Cardiovascular: Normal rate and regular rhythm. Exam reveals no gallop and no friction rub.  No murmur heard. Pulmonary/Chest: Effort normal and breath sounds normal. No  respiratory distress. She has no wheezes. She has no rales.  Her lungs.  No increased work of breathing.  Recheck temp 98 7.  Saturating 99%  Abdominal: Soft. Bowel sounds are normal. She exhibits no distension. There is no tenderness. There is no rebound.  Musculoskeletal: Normal range of motion.  Neurological: She is alert and oriented to person, place, and time.  Skin: Skin is warm and dry. No rash noted.  Psychiatric: She has a normal mood and affect. Her behavior is normal.     ED Treatments / Results  Labs (all labs ordered are listed, but only abnormal results are displayed) Labs Reviewed - No data to display  EKG  EKG Interpretation None       Radiology Dg Chest 2 View  Result Date: 02/13/2018 CLINICAL DATA:  Shortness of breath, congestion and difficulty breathing today. Smoker. EXAM: CHEST  2 VIEW COMPARISON:  Chest radiograph January 10, 2017 FINDINGS: Cardiomediastinal silhouette is normal. No pleural effusions or focal consolidations. Trachea projects midline and there is no pneumothorax. Soft tissue planes and included osseous structures are non-suspicious. IMPRESSION: Negative. Electronically Signed   By: Awilda Metroourtnay  Bloomer M.D.   On: 02/13/2018 22:59    Procedures Procedures (including critical care time)  Medications Ordered in ED Medications - No data to display   Initial Impression / Assessment and Plan / ED Course  I have reviewed the triage vital signs and the nursing notes.  Pertinent labs & imaging results that were available during my care of the patient were reviewed by me and considered in my medical decision making (see chart for details).     Symptomatic treatment for probable viral syndrome.  Not suspicious clinically of influenza or pneumonia.  Negative x-ray.  Final Clinical Impressions(s) / ED Diagnoses   Final diagnoses:  Viral syndrome    ED Discharge Orders    None       Rolland PorterJames, Laurali Goddard, MD 02/14/18 [phone]

## 2018-02-14 NOTE — Discharge Instructions (Signed)
Motrin or Tylenol for body aches. Robitussin for cough as needed.

## 2018-02-14 NOTE — ED Notes (Signed)
EDP into room, prior to RN assessment, see MD notes, orders received to d/c.   Alert, NAD, calm, interactive, resps e/u, speaking in clear complete sentences, no dyspnea noted, skin W&D, VSS, here for cough and fever, some sore throat, (denies: pain, sob, NVD, dizziness or visual changes).   Standing in room, dressed and "ready to go". Denies questions or needs.

## 2018-02-21 ENCOUNTER — Other Ambulatory Visit: Payer: Self-pay

## 2018-02-21 ENCOUNTER — Encounter (HOSPITAL_BASED_OUTPATIENT_CLINIC_OR_DEPARTMENT_OTHER): Payer: Self-pay | Admitting: Emergency Medicine

## 2018-02-21 ENCOUNTER — Emergency Department (HOSPITAL_BASED_OUTPATIENT_CLINIC_OR_DEPARTMENT_OTHER)
Admission: EM | Admit: 2018-02-21 | Discharge: 2018-02-21 | Disposition: A | Discharge status: Home / Self Care | Payer: Medicare PPO | Attending: Emergency Medicine | Admitting: Emergency Medicine

## 2018-02-21 ENCOUNTER — Emergency Department (HOSPITAL_BASED_OUTPATIENT_CLINIC_OR_DEPARTMENT_OTHER): Payer: Medicare PPO

## 2018-02-21 DIAGNOSIS — F17210 Nicotine dependence, cigarettes, uncomplicated: Secondary | ICD-10-CM | POA: Diagnosis not present

## 2018-02-21 DIAGNOSIS — R0602 Shortness of breath: Secondary | ICD-10-CM | POA: Diagnosis not present

## 2018-02-21 DIAGNOSIS — R197 Diarrhea, unspecified: Secondary | ICD-10-CM | POA: Diagnosis not present

## 2018-02-21 DIAGNOSIS — R05 Cough: Secondary | ICD-10-CM | POA: Insufficient documentation

## 2018-02-21 DIAGNOSIS — R059 Cough, unspecified: Secondary | ICD-10-CM

## 2018-02-21 LAB — CBC WITH DIFFERENTIAL/PLATELET
BASOS ABS: 0 10*3/uL (ref 0.0–0.1)
BASOS PCT: 0 %
Eosinophils Absolute: 0 10*3/uL (ref 0.0–0.7)
Eosinophils Relative: 0 %
HEMATOCRIT: 39.3 % (ref 36.0–46.0)
HEMOGLOBIN: 13.5 g/dL (ref 12.0–15.0)
LYMPHS PCT: 42 %
Lymphs Abs: 4 10*3/uL (ref 0.7–4.0)
MCH: 29.3 pg (ref 26.0–34.0)
MCHC: 34.4 g/dL (ref 30.0–36.0)
MCV: 85.4 fL (ref 78.0–100.0)
MONO ABS: 0.5 10*3/uL (ref 0.1–1.0)
Monocytes Relative: 5 %
NEUTROS ABS: 5.1 10*3/uL (ref 1.7–7.7)
NEUTROS PCT: 53 %
Platelets: 221 10*3/uL (ref 150–400)
RBC: 4.6 MIL/uL (ref 3.87–5.11)
RDW: 13.2 % (ref 11.5–15.5)
WBC: 9.6 10*3/uL (ref 4.0–10.5)

## 2018-02-21 LAB — BASIC METABOLIC PANEL
ANION GAP: 10 (ref 5–15)
BUN: 9 mg/dL (ref 6–20)
CALCIUM: 9.1 mg/dL (ref 8.9–10.3)
CO2: 21 mmol/L — AB (ref 22–32)
Chloride: 104 mmol/L (ref 101–111)
Creatinine, Ser: 0.79 mg/dL (ref 0.44–1.00)
GLUCOSE: 96 mg/dL (ref 65–99)
Potassium: 3.8 mmol/L (ref 3.5–5.1)
Sodium: 135 mmol/L (ref 135–145)

## 2018-02-21 LAB — D-DIMER, QUANTITATIVE: D-Dimer, Quant: 0.48 ug/mL-FEU (ref 0.00–0.50)

## 2018-02-21 LAB — TROPONIN I

## 2018-02-21 MED ORDER — ALBUTEROL SULFATE HFA 108 (90 BASE) MCG/ACT IN AERS: 1.0000 | INHALATION_SPRAY | Freq: Four times a day (QID) | RESPIRATORY_TRACT | 0 refills | Status: DC | PRN

## 2018-02-21 MED ORDER — BENZONATATE 100 MG PO CAPS: 100.0000 mg | ORAL_CAPSULE | Freq: Three times a day (TID) | ORAL | 0 refills | Status: DC

## 2018-02-21 NOTE — ED Notes (Signed)
[facility], pt updated with results and d/c plan. Pt alert, NAD, calm, interactive, resps e/u, speaking in clear complete sentences, no dyspnea noted, skin W&D, VSS, mentions intermittent cough with associated chest discomfort with cough,  (denies: HA, sob, nausea, dizziness or visual changes), mentions hunger, and "ready to go". Snack provided.

## 2018-02-21 NOTE — ED Notes (Signed)
Notified lab of recent troponin ordered, added on.

## 2018-02-21 NOTE — ED Notes (Signed)
Pt given d/c instructions as per chart. Rx x 2. Verbalizes understanding. No questions. 

## 2018-02-21 NOTE — ED Triage Notes (Signed)
Pt c/o SOB and cough since yesterday. Pt is reading a list of symptoms off of webMD.

## 2018-02-21 NOTE — Discharge Instructions (Signed)
Your blood work has been very reassuring in the ED. your symptoms likely related to your recent URI.  Use the albuterol for any wheezing.  Tessalon for cough.  Take NSAIDs such as Aleve or ibuprofen.  Follow-up with a primary care doctor return to the ED with any worsening symptoms.  Plenty of fluids stay hydrated.

## 2018-02-21 NOTE — ED Provider Notes (Signed)
[facility] EMERGENCY DEPARTMENT Provider Note   CSN: [id number] Arrival date & time: 02/21/18  1643     History   Chief Complaint Chief Complaint  Patient presents with  . Shortness of Breath    HPI Patty Hughes is a 32 y.o. female.  HPI 32 year old Caucasian female past medical history significant for anxiety, bipolar disorder, PTSD, schizoaffective disorder presents to the emergency department today for ongoing cough, diarrhea, intermittent shortness of breath.  Patient states that her symptoms have been on and off for the past week.  She does report episode of diarrhea yesterday but denies any further episodes of diarrhea.  Denies any associated emesis.  Does report some mild nausea.  Patient states that her chest hurts when she coughs.  She also reports some shortness of breath.  Denies any associated chest pain.  Denies any sputum production.  Patient states that she was seen on 2/15 in the ED and diagnosed with a viral URI.  Patient does report tobacco use.  Patient denies any history of DVT/PE, prolonged immobilization, recent hospitalization/surgeries, unilateral leg swelling or calf tenderness, hemoptysis.  Does report some tobacco use.  She also reports having NuvaRing.  Patient denies any cardiac history.  Denies any associated diaphoresis.  Denies any pleuritic chest pain.  Denies any exertional chest pain.  Patient was reading the Internet tonight was concerned that she might have pneumonia.  Denies any associated fevers.  She has not taking for symptoms prior to arrival.  Nothing makes his symptoms better or worse.  Pt denies any fever, chill, ha, vision changes, lightheadedness, dizziness, congestion, neck pain, abd pain, n/v/d, urinary symptoms, change in bowel habits, melena, hematochezia, lower extremity paresthesias.  Past Medical History:  Diagnosis Date  . Anxiety   . Bipolar 1 disorder (HCC)   . Borderline personality disorder (HCC)   . Fatty liver     . PTSD (post-traumatic stress disorder)   . Schizoaffective disorder [facility])     Patient Active Problem List   Diagnosis Date Noted  . Achilles tendonitis, bilateral 09/06/2015  . Equinus deformity of foot, acquired 09/06/2015  . Metatarsal deformity 09/06/2015    History reviewed. No pertinent surgical history.  OB History    No data available       Home Medications    Prior to Admission medications   Medication Sig Start Date End Date Taking? Authorizing Provider  clonazePAM (KLONOPIN) 1 MG tablet Take 1 mg by mouth 3 (three) times daily as needed for anxiety.    [provider]  DULoxetine (CYMBALTA) 30 MG capsule Take 30 mg by mouth daily.    [provider]  FANAPT 6 MG TABS  08/03/15   [provider]  NUVARING 0.12-0.015 MG/24HR vaginal ring  08/30/15   [provider]  QUEtiapine (SEROQUEL) 50 MG tablet Take 50 mg by mouth at bedtime.    [provider]    Family History No family history on file.  Social History Social History   Tobacco Use  . Smoking status: Current Every Day Smoker    Packs/day: 1.50    Types: Cigarettes  . Smokeless tobacco: Never Used  Substance Use Topics  . Alcohol use: Yes    Alcohol/week: 0.0 oz    Comment: "sometimes, not much"  . Drug use: No     Allergies   Haldol [haloperidol]; Risperidone and related; and Lamictal [lamotrigine]   Review of Systems Review of Systems  Constitutional: Negative for chills, diaphoresis and fever.  HENT: Positive for congestion.   Eyes: Negative for visual disturbance.  Respiratory: Positive for cough and shortness of breath. Negative for wheezing.   Cardiovascular: Positive for chest pain (with cough). Negative for palpitations and leg swelling.  Gastrointestinal: Negative for abdominal pain, diarrhea, nausea and vomiting.  Genitourinary: Negative for dysuria, flank pain, frequency, hematuria and urgency.  Musculoskeletal: Negative for arthralgias  and myalgias.  Skin: Negative for rash.  Neurological: Negative for dizziness, syncope, weakness, light-headedness, numbness and headaches.  Psychiatric/Behavioral: Negative for sleep disturbance. The patient is not nervous/anxious.      Physical Exam Updated Vital Signs BP 140/88 (BP Location: Left Arm)   Pulse (!) 108   Temp 98.8 F (37.1 C) (Oral)   Resp 20   Ht 5\' 4"  (1.626 m)   Wt 112.5 kg (248 lb)   LMP 01/30/2018   SpO2 98%   BMI 42.57 kg/m   Physical Exam  Constitutional: She is oriented to person, place, and time. She appears well-developed and well-nourished.  Non-toxic appearance. No distress.  HENT:  Head: Normocephalic and atraumatic.  Nose: Nose normal.  Mouth/Throat: Oropharynx is clear and moist.  Eyes: Conjunctivae are normal. Pupils are equal, round, and reactive to light. Right eye exhibits no discharge. Left eye exhibits no discharge.  Neck: Normal range of motion. Neck supple. No JVD present. No tracheal deviation present.  Cardiovascular: Normal rate, regular rhythm, normal heart sounds and intact distal pulses.  Pulmonary/Chest: Effort normal and breath sounds normal. No tachypnea. No respiratory distress. She has no decreased breath sounds. She has no wheezes. She has no rhonchi. She has no rales. She exhibits tenderness.  No hypoxia or tachypnea.  Abdominal: Soft. Bowel sounds are normal. She exhibits no distension. There is no tenderness. There is no rigidity, no rebound, no guarding, no CVA tenderness, no tenderness at McBurney's point and negative Murphy's sign.  Musculoskeletal: Normal range of motion.       Right lower leg: Normal. She exhibits no edema.       Left lower leg: Normal. She exhibits no edema.  No lower extremity edema or calf tenderness.  Lymphadenopathy:    She has no cervical adenopathy.  Neurological: She is alert and oriented to person, place, and time.  Skin: Skin is warm and dry. Capillary refill takes less than 2 seconds. She  is not diaphoretic.  Psychiatric: Her behavior is normal. Judgment and thought content normal.  Nursing note and vitals reviewed.    ED Treatments / Results  Labs (all labs ordered are listed, but only abnormal results are displayed) Labs Reviewed  BASIC METABOLIC PANEL - Abnormal; Notable for the following components:      Result Value   CO2 21 (*)    All other components within normal limits  CBC WITH DIFFERENTIAL/PLATELET  D-DIMER, QUANTITATIVE (NOT AT [facility])  TROPONIN I    EKG  EKG Interpretation  Date/Time:  Saturday February 21 2018 19:13:13 EST Ventricular Rate:  69 PR Interval:    QRS Duration: 90 QT Interval:  383 QTC Calculation: 411 R Axis:   41 Text Interpretation:  Sinus rhythm Baseline wander in lead(s) II III aVF When compared to prior, No significant changes seen. No STEMI Confirmed by Theda Belfast ([id number]) on 02/21/2018 7:55:52 PM       Radiology Dg Chest 2 View  Result Date: 02/21/2018 CLINICAL DATA:  Cough and congestion EXAM: CHEST  2 VIEW COMPARISON:  02/13/2018, 01/10/2017 FINDINGS: The heart size and mediastinal contours are within normal limits.  Both lungs are clear. The visualized skeletal structures are unremarkable. IMPRESSION: No active cardiopulmonary disease. Electronically Signed   By: Jasmine PangKim  Fujinaga M.D.   On: 02/21/2018 19:04    Procedures Procedures (including critical care time)  Medications Ordered in ED Medications - No data to display   Initial Impression / Assessment and Plan / ED Course  I have reviewed the triage vital signs and the nursing notes.  Pertinent labs & imaging results that were available during my care of the patient were reviewed by me and considered in my medical decision making (see chart for details).     Pt presents to the Ed today with complaints of sob, cough. Patient is to be discharged with recommendation to follow up with PCP in regards to today's hospital visit. SOP and chest discomfort is not likely  of cardiac or pulmonary etiology d/t presentation, d-dimer negative, VSS, no tracheal deviation, no JVD or new murmur, RRR, breath sounds equal bilaterally, EKG without any change from prior tracing and shows no signs of ischemia, negativetroponin, and negative CXR.  Patient heart pathway score is 1.  They are atypical for ACS.  Pt has been advised to return to the ED is CP becomes exertional, associated with diaphoresis or nausea, radiates to left jaw/arm, worsens or becomes concerning in any way.  Chest x-ray shows no signs of pneumonia.  She does report some one episode of diarrhea.  Abdominal exam is benign.  No signs of significant dehydration.  Denies any associated nausea or vomiting.  Denies any urinary symptoms.  Pt appears reliable for follow up and is agreeable to discharge.  Patient symptoms likely consistent with costochondritis versus pleurisy given recent URI.  Patient will be given an inhaler, cough medicine and encouraged NSAIDs at home.  She reports one episode of diarrhea yesterday however she has no focal abdominal tenderness.  She is afebrile.  No bloody stools.  Doubt any intra-abdominal pathology at this time.  Pt is hemodynamically stable, in NAD, & able to ambulate in the ED. Evaluation does not show pathology that would require ongoing emergent intervention or inpatient treatment. I explained the diagnosis to the patient. Pain has been managed & has no complaints prior to dc. Pt is comfortable with above plan and is stable for discharge at this time. All questions were answered prior to disposition. Strict return precautions for f/u to the ED were discussed. Encouraged follow up with PCP.     Final Clinical Impressions(s) / ED Diagnoses   Final diagnoses:  Shortness of breath  Cough  Diarrhea, unspecified type    ED Discharge Orders        Ordered    benzonatate (TESSALON) 100 MG capsule  Every 8 hours     02/21/18 2037    albuterol (PROVENTIL HFA;VENTOLIN HFA) 108 (90  Base) MCG/ACT inhaler  Every 6 hours PRN     02/21/18 2037       Wallace KellerLeaphart, Kenneth T, PA-C 02/21/18 2039    Tegeler, Canary Brimhristopher J, MD 02/22/18 [phone]

## 2018-05-22 ENCOUNTER — Other Ambulatory Visit: Payer: Self-pay

## 2018-05-22 ENCOUNTER — Emergency Department (HOSPITAL_BASED_OUTPATIENT_CLINIC_OR_DEPARTMENT_OTHER)
Admission: EM | Admit: 2018-05-22 | Discharge: 2018-05-22 | Disposition: A | Discharge status: Home / Self Care | Payer: Medicare PPO | Attending: Emergency Medicine | Admitting: Emergency Medicine

## 2018-05-22 ENCOUNTER — Encounter (HOSPITAL_BASED_OUTPATIENT_CLINIC_OR_DEPARTMENT_OTHER): Payer: Self-pay

## 2018-05-22 DIAGNOSIS — Z5321 Procedure and treatment not carried out due to patient leaving prior to being seen by health care provider: Secondary | ICD-10-CM | POA: Diagnosis not present

## 2018-05-22 DIAGNOSIS — R102 Pelvic and perineal pain: Secondary | ICD-10-CM | POA: Insufficient documentation

## 2018-05-22 LAB — URINALYSIS, ROUTINE W REFLEX MICROSCOPIC
Bilirubin Urine: NEGATIVE
Glucose, UA: NEGATIVE mg/dL
Ketones, ur: 15 mg/dL — AB
NITRITE: NEGATIVE
PROTEIN: NEGATIVE mg/dL
Specific Gravity, Urine: >1.03 — ABNORMAL HIGH (ref 1.005–1.030)
pH: 6 (ref 5.0–8.0)

## 2018-05-22 LAB — URINALYSIS, MICROSCOPIC (REFLEX)

## 2018-05-22 LAB — PREGNANCY, URINE: PREG TEST UR: NEGATIVE

## 2018-05-22 NOTE — ED Notes (Signed)
Pt given gown to put on and pelvic cart set up at door and updated to wait time

## 2018-05-22 NOTE — ED Notes (Signed)
Pt came out of room asking if we forgot about her.  Pt updated to wait time and plan of care.

## 2018-05-22 NOTE — ED Triage Notes (Signed)
C/o pelvic pain, lower back pain x "months"-was seen at [facility] this week-dx bladder infection rx macrobid-called [facility] today to report increase in back pain-was advised her urine "was clean and didn't need to complete abx"-NAD-steady gait

## 2018-05-23 NOTE — ED Provider Notes (Signed)
I did not see or evaluate the patient.  LWBS   Patty Bilis, MD 05/23/18 [phone]

## 2018-08-20 ENCOUNTER — Encounter (HOSPITAL_BASED_OUTPATIENT_CLINIC_OR_DEPARTMENT_OTHER): Payer: Self-pay

## 2018-08-20 ENCOUNTER — Emergency Department (HOSPITAL_BASED_OUTPATIENT_CLINIC_OR_DEPARTMENT_OTHER)
Admission: EM | Admit: 2018-08-20 | Discharge: 2018-08-20 | Disposition: A | Discharge status: Home / Self Care | Payer: Medicare PPO | Attending: Emergency Medicine | Admitting: Emergency Medicine

## 2018-08-20 ENCOUNTER — Other Ambulatory Visit: Payer: Self-pay

## 2018-08-20 DIAGNOSIS — J069 Acute upper respiratory infection, unspecified: Secondary | ICD-10-CM | POA: Diagnosis not present

## 2018-08-20 DIAGNOSIS — Z79899 Other long term (current) drug therapy: Secondary | ICD-10-CM | POA: Insufficient documentation

## 2018-08-20 DIAGNOSIS — F17210 Nicotine dependence, cigarettes, uncomplicated: Secondary | ICD-10-CM | POA: Insufficient documentation

## 2018-08-20 DIAGNOSIS — B9789 Other viral agents as the cause of diseases classified elsewhere: Secondary | ICD-10-CM | POA: Insufficient documentation

## 2018-08-20 DIAGNOSIS — R05 Cough: Secondary | ICD-10-CM | POA: Diagnosis present

## 2018-08-20 MED ORDER — HYDROCOD POLST-CPM POLST ER 10-8 MG/5ML PO SUER: 5.0000 mL | Freq: Two times a day (BID) | ORAL | 0 refills | Status: DC | PRN

## 2018-08-20 NOTE — ED Provider Notes (Signed)
[facility] EMERGENCY DEPARTMENT Provider Note   CSN: [id number] Arrival date & time: 08/20/18  2103     History   Chief Complaint Chief Complaint  Patient presents with  . Cough    HPI Patty Hughes is a 32 y.o. female.  32 yo F with a cc of cough.  Going on for the past week and a half.  Patient seen by urgent care, started on Augmentin.  Having worsening cough.   The history is provided by the patient.  Cough  This is a new problem. The current episode started more than 1 week ago. The problem occurs constantly. The problem has not changed since onset.The cough is non-productive. Associated symptoms include rhinorrhea and shortness of breath. Pertinent negatives include no chest pain, no chills, no headaches, no myalgias, no wheezing and no eye redness. She has tried nothing for the symptoms. The treatment provided no relief. She is not a smoker.    Past Medical History:  Diagnosis Date  . Anxiety   . Bipolar 1 disorder (HCC)   . Borderline personality disorder (HCC)   . Fatty liver   . PTSD (post-traumatic stress disorder)   . Schizoaffective disorder [facility])     Patient Active Problem List   Diagnosis Date Noted  . Achilles tendonitis, bilateral 09/06/2015  . Equinus deformity of foot, acquired 09/06/2015  . Metatarsal deformity 09/06/2015    History reviewed. No pertinent surgical history.   OB History   None      Home Medications    Prior to Admission medications   Medication Sig Start Date End Date Taking? Authorizing Provider  albuterol (PROVENTIL HFA;VENTOLIN HFA) 108 (90 Base) MCG/ACT inhaler Inhale 1-2 puffs into the lungs every 6 (six) hours as needed for wheezing or shortness of breath. 02/21/18   Rise Mu, PA-C  benzonatate (TESSALON) 100 MG capsule Take 1 capsule (100 mg total) by mouth every 8 (eight) hours. 02/21/18   Rise Mu, PA-C  chlorpheniramine-HYDROcodone (TUSSIONEX PENNKINETIC ER) 10-8 MG/5ML SUER Take 5  mLs by mouth every 12 (twelve) hours as needed for cough. 08/20/18   Melene Plan, DO  clonazePAM (KLONOPIN) 1 MG tablet Take 1 mg by mouth 3 (three) times daily as needed for anxiety.    [provider]  DULoxetine (CYMBALTA) 30 MG capsule Take 30 mg by mouth daily.    [provider]  FANAPT 6 MG TABS  08/03/15   [provider]  NUVARING 0.12-0.015 MG/24HR vaginal ring  08/30/15   [provider]  QUEtiapine (SEROQUEL) 50 MG tablet Take 50 mg by mouth at bedtime.    [provider]    Family History No family history on file.  Social History Social History   Tobacco Use  . Smoking status: Current Every Day Smoker    Packs/day: 1.50    Types: Cigarettes  . Smokeless tobacco: Never Used  Substance Use Topics  . Alcohol use: Yes    Alcohol/week: 0.0 standard drinks    Comment: weekly  . Drug use: No     Allergies   Haldol [haloperidol]; Risperidone and related; and Lamictal [lamotrigine]   Review of Systems Review of Systems  Constitutional: Negative for chills and fever.  HENT: Positive for congestion, rhinorrhea and sinus pain.   Eyes: Negative for redness and visual disturbance.  Respiratory: Positive for cough and shortness of breath. Negative for wheezing.   Cardiovascular: Negative for chest pain and palpitations.  Gastrointestinal: Negative for nausea and vomiting.  Genitourinary: Negative for dysuria and urgency.  Musculoskeletal: Negative for arthralgias and myalgias.  Skin: Negative for pallor and wound.  Neurological: Negative for dizziness and headaches.     Physical Exam Updated Vital Signs BP 127/80 (BP Location: Left Arm)   Pulse 100   Temp 98.2 F (36.8 C) (Oral)   Resp 18   SpO2 99%   Physical Exam  Constitutional: She is oriented to person, place, and time. She appears well-developed and well-nourished. No distress.  HENT:  Head: Normocephalic and atraumatic.  Swollen turbinates, posterior nasal drip,  no noted sinus ttp, tm normal bilaterally.    Eyes: Pupils are equal, round, and reactive to light. EOM are normal.  Neck: Normal range of motion. Neck supple.  Cardiovascular: Normal rate and regular rhythm. Exam reveals no gallop and no friction rub.  No murmur heard. Pulmonary/Chest: Effort normal. She has no wheezes. She has no rales.  Abdominal: Soft. She exhibits no distension and no mass. There is no tenderness. There is no guarding.  Musculoskeletal: She exhibits no edema or tenderness.  Neurological: She is alert and oriented to person, place, and time.  Skin: Skin is warm and dry. She is not diaphoretic.  Psychiatric: She has a normal mood and affect. Her behavior is normal.  Nursing note and vitals reviewed.    ED Treatments / Results  Labs (all labs ordered are listed, but only abnormal results are displayed) Labs Reviewed - No data to display  EKG None  Radiology No results found.  Procedures Procedures (including critical care time)  Medications Ordered in ED Medications - No data to display   Initial Impression / Assessment and Plan / ED Course  I have reviewed the triage vital signs and the nursing notes.  Pertinent labs & imaging results that were available during my care of the patient were reviewed by me and considered in my medical decision making (see chart for details).     32 yo F with a cc of a cough.  Seen recently and started on abx.  Augmentin would cover pneumonia.  Have feel limited utility of obtaining a chest x-ray of her here.  She is coughing profusely but no adventitious lung sounds.  No hypoxia.  Will start on cough medicine.  PCP follow-up.  11:39 PM:  I have discussed the diagnosis/risks/treatment options with the patient and believe the pt to be eligible for discharge home to follow-up with PCP. We also discussed returning to the ED immediately if new or worsening sx occur. We discussed the sx which are most concerning (e.g., sudden  worsening pain, fever, inability to tolerate by mouth) that necessitate immediate return. Medications administered to the patient during their visit and any new prescriptions provided to the patient are listed below.  Medications given during this visit Medications - No data to display    The patient appears reasonably screen and/or stabilized for discharge and I doubt any other medical condition or other [facility] requiring further screening, evaluation, or treatment in the ED at this time prior to discharge.    Final Clinical Impressions(s) / ED Diagnoses   Final diagnoses:  Viral URI with cough    ED Discharge Orders         Ordered    chlorpheniramine-HYDROcodone (TUSSIONEX PENNKINETIC ER) 10-8 MG/5ML SUER  Every 12 hours PRN     08/20/18 2242           Melene PlanFloyd, Rolande Moe, DO 08/20/18 2339

## 2018-08-20 NOTE — Discharge Instructions (Signed)
Follow up with your PCP Take tylenol 2 pills 4 times a day and motrin 4 pills 3 times a day.  Drink plenty of fluids.  Return for worsening shortness of breath, headache, confusion. Follow up with your family doctor.

## 2018-08-20 NOTE — ED Triage Notes (Signed)
Pt reports recent d/x of sinusitis. Is taking augmentin, albuterol, zyrtec from [facility]. Reports worsening cough.

## 2018-09-01 ENCOUNTER — Encounter (HOSPITAL_BASED_OUTPATIENT_CLINIC_OR_DEPARTMENT_OTHER): Payer: Self-pay | Admitting: *Deleted

## 2018-09-01 ENCOUNTER — Other Ambulatory Visit: Payer: Self-pay

## 2018-09-01 ENCOUNTER — Emergency Department (HOSPITAL_BASED_OUTPATIENT_CLINIC_OR_DEPARTMENT_OTHER)
Admission: EM | Admit: 2018-09-01 | Discharge: 2018-09-01 | Disposition: A | Discharge status: Home / Self Care | Payer: Medicare PPO | Attending: Emergency Medicine | Admitting: Emergency Medicine

## 2018-09-01 DIAGNOSIS — F10129 Alcohol abuse with intoxication, unspecified: Secondary | ICD-10-CM | POA: Diagnosis present

## 2018-09-01 DIAGNOSIS — F1010 Alcohol abuse, uncomplicated: Secondary | ICD-10-CM | POA: Diagnosis not present

## 2018-09-01 DIAGNOSIS — R110 Nausea: Secondary | ICD-10-CM

## 2018-09-01 DIAGNOSIS — F17210 Nicotine dependence, cigarettes, uncomplicated: Secondary | ICD-10-CM | POA: Diagnosis not present

## 2018-09-01 DIAGNOSIS — F419 Anxiety disorder, unspecified: Secondary | ICD-10-CM | POA: Insufficient documentation

## 2018-09-01 DIAGNOSIS — Z79899 Other long term (current) drug therapy: Secondary | ICD-10-CM | POA: Insufficient documentation

## 2018-09-01 DIAGNOSIS — E860 Dehydration: Secondary | ICD-10-CM

## 2018-09-01 MED ORDER — ONDANSETRON HCL 4 MG/2ML IJ SOLN
4.0000 mg | Freq: Once | INTRAMUSCULAR | Status: AC
  Administered 2018-09-01: 4 mg via INTRAVENOUS
  Filled 2018-09-01: qty 2

## 2018-09-01 MED ORDER — SODIUM CHLORIDE 0.9 % IV BOLUS
1000.0000 mL | Freq: Once | INTRAVENOUS | Status: AC
  Administered 2018-09-01: 1000 mL via INTRAVENOUS

## 2018-09-01 MED ORDER — KETOROLAC TROMETHAMINE 15 MG/ML IJ SOLN
15.0000 mg | Freq: Once | INTRAMUSCULAR | Status: AC
  Administered 2018-09-01: 15 mg via INTRAVENOUS
  Filled 2018-09-01: qty 1

## 2018-09-01 NOTE — Discharge Instructions (Signed)
Please avoid drinking alcohol especially in large amounts as this interacts with your psychiatric medications.  Continue drinking plenty of fluids and your symptoms should continue to improve.  If you develop severe vomiting, abdominal pain or any other new or concerning symptoms or have thoughts of harming herself or others please return to the emergency department for reevaluation.

## 2018-09-01 NOTE — ED Notes (Signed)
Pt given gingerale for PO challenge 

## 2018-09-01 NOTE — ED Triage Notes (Addendum)
Pt c/o " too much alcohol" last drink last night. Pt requesting IV fluids , pt rambling and tearful in triage.

## 2018-09-01 NOTE — ED Provider Notes (Signed)
[facility] EMERGENCY DEPARTMENT Provider Note   CSN: [id number] Arrival date & time: 09/01/18  1626     History   Chief Complaint Chief Complaint  Patient presents with  . Alcohol Intoxication    HPI Patty Hughes is a 32 y.o. female.  Patty Hughes is a 32 y.o. Female with a history of PTSD, schizoaffective disorder, bipolar disorder, and anxiety, who presents to the emergency department for evaluation after reporting she "drank too much alcohol last night".  Patient reports late last night she drank 2 bottles of wine on her own.  She reports she woke up this morning with headache and reports she is just felt generally unwell all day.  She feels nauseated and queasy but has not had any vomiting, denies any abdominal pain.  No fevers or chills.  She reports her head feels foggy and she is just exhausted.  She reports she began googling what the symptoms could be in conjunction with alcohol and became very anxious and concerned she could have alcohol poisoning or die, at which point she developed some transient chest tightness which lasted a few seconds and resolved.  She has not had any additional episodes of chest pain or shortness of breath.  Reports she does not typically drink alcohol and has never drink this much before, has never had the symptoms before but her family members told her that she likely had a hangover, despite this patient was very concerned so came in for evaluation.  Patient reports she thinks that her mental illness has contributed to her anxiety today, she has been taking her medications regularly.  She denies any thoughts of harming herself or others, no auditory or visual hallucinations.     Past Medical History:  Diagnosis Date  . Anxiety   . Bipolar 1 disorder (HCC)   . Borderline personality disorder (HCC)   . Fatty liver   . PTSD (post-traumatic stress disorder)   . Schizoaffective disorder [facility])     Patient Active Problem List   Diagnosis Date Noted  . Achilles tendonitis, bilateral 09/06/2015  . Equinus deformity of foot, acquired 09/06/2015  . Metatarsal deformity 09/06/2015    History reviewed. No pertinent surgical history.   OB History   None      Home Medications    Prior to Admission medications   Medication Sig Start Date End Date Taking? Authorizing Provider  albuterol (PROVENTIL HFA;VENTOLIN HFA) 108 (90 Base) MCG/ACT inhaler Inhale 1-2 puffs into the lungs every 6 (six) hours as needed for wheezing or shortness of breath. 02/21/18   Rise Mu, PA-C  benzonatate (TESSALON) 100 MG capsule Take 1 capsule (100 mg total) by mouth every 8 (eight) hours. 02/21/18   Rise Mu, PA-C  chlorpheniramine-HYDROcodone (TUSSIONEX PENNKINETIC ER) 10-8 MG/5ML SUER Take 5 mLs by mouth every 12 (twelve) hours as needed for cough. 08/20/18   Melene Plan, DO  clonazePAM (KLONOPIN) 1 MG tablet Take 1 mg by mouth 3 (three) times daily as needed for anxiety.    [provider]  DULoxetine (CYMBALTA) 30 MG capsule Take 30 mg by mouth daily.    [provider]  FANAPT 6 MG TABS  08/03/15   [provider]  NUVARING 0.12-0.015 MG/24HR vaginal ring  08/30/15   [provider]  QUEtiapine (SEROQUEL) 50 MG tablet Take 50 mg by mouth at bedtime.    [provider]    Family History History reviewed. No pertinent family history.  Social History  Social History   Tobacco Use  . Smoking status: Current Every Day Smoker    Packs/day: 1.50    Types: Cigarettes  . Smokeless tobacco: Never Used  Substance Use Topics  . Alcohol use: Yes    Alcohol/week: 0.0 standard drinks    Comment: weekly  . Drug use: No     Allergies   Haldol [haloperidol]; Risperidone and related; and Lamictal [lamotrigine]   Review of Systems Review of Systems  Constitutional: Negative for chills and fever.  HENT: Negative.   Respiratory: Positive for chest tightness. Negative for cough  and shortness of breath.   Cardiovascular: Negative for chest pain, palpitations and leg swelling.  Gastrointestinal: Positive for nausea. Negative for abdominal pain, diarrhea and vomiting.  Genitourinary: Negative for dysuria and frequency.  Musculoskeletal: Negative for arthralgias and myalgias.  Skin: Negative for color change, rash and wound.  Neurological: Positive for headaches. Negative for dizziness, syncope and light-headedness.  Psychiatric/Behavioral: Negative for hallucinations and suicidal ideas. The patient is nervous/anxious.      Physical Exam Updated Vital Signs BP 129/84   Pulse 88   Temp 98.7 F (37.1 C)   Resp 16   Ht 5\' 6"  (1.676 m)   Wt 113.4 kg   SpO2 97%   BMI 40.35 kg/m   Physical Exam  Constitutional: She is oriented to person, place, and time. She appears well-developed and well-nourished. No distress.  Appears anxious and intermittently becomes tearful but is alert and in no acute distress  HENT:  Head: Normocephalic and atraumatic.  Mouth/Throat: Oropharynx is clear and moist.  Eyes: Pupils are equal, round, and reactive to light. EOM are normal. Right eye exhibits no discharge. Left eye exhibits no discharge.  Neck: Neck supple.  Cardiovascular: Normal rate, regular rhythm, normal heart sounds and intact distal pulses. Exam reveals no gallop and no friction rub.  No murmur heard. Pulmonary/Chest: Effort normal and breath sounds normal. No respiratory distress. She has no wheezes. She has no rales.  Respirations equal and unlabored, patient able to speak in full sentences, lungs clear to auscultation bilaterally  Abdominal: Soft. Bowel sounds are normal. She exhibits no distension and no mass. There is no tenderness. There is no guarding.  Abdomen soft, nondistended, nontender to palpation in all quadrants without guarding or peritoneal signs  Musculoskeletal: She exhibits no edema or deformity.  Neurological: She is alert and oriented to person,  place, and time. Coordination normal.  Speech is clear, able to follow commands CN III-XII intact Normal strength in upper and lower extremities bilaterally including dorsiflexion and plantar flexion, strong and equal grip strength Sensation normal to light and sharp touch Moves extremities without ataxia, coordination intact  Skin: Skin is warm and dry. Capillary refill takes less than 2 seconds. She is not diaphoretic.  Psychiatric: Her speech is normal and behavior is normal. Her mood appears anxious. She is not actively hallucinating. She expresses no homicidal and no suicidal ideation. She expresses no suicidal plans and no homicidal plans.  Nursing note and vitals reviewed.    ED Treatments / Results  Labs (all labs ordered are listed, but only abnormal results are displayed) Labs Reviewed - No data to display  EKG EKG Interpretation  Date/Time:  Tuesday September 01 2018 17:22:29 EDT Ventricular Rate:  75 PR Interval:    QRS Duration: 87 QT Interval:  387 QTC Calculation: 433 R Axis:   45 Text Interpretation:  Sinus rhythm Confirmed by Vanetta Mulders [id number]) on 09/01/2018 6:25:14 PM  Radiology No results found.  Procedures Procedures (including critical care time)  Medications Ordered in ED Medications  sodium chloride 0.9 % bolus 1,000 mL ( Intravenous Stopped 09/01/18 1815)  ondansetron (ZOFRAN) injection 4 mg (4 mg Intravenous Given 09/01/18 1706)  ketorolac (TORADOL) 15 MG/ML injection 15 mg (15 mg Intravenous Given 09/01/18 1706)     Initial Impression / Assessment and Plan / ED Course  I have reviewed the triage vital signs and the nursing notes.  Pertinent labs & imaging results that were available during my care of the patient were reviewed by me and considered in my medical decision making (see chart for details).  Presents with mild headache, nausea and just feeling generally unwell after drinking 2 bottles of wine last night, is on multiple psychiatric  medications and does not usually consume alcohol.  She reports she began googling symptoms regarding alcohol poisoning and became extremely anxious at which point she had a transient episode of chest tightness.  Her EKG here in the ED is normal, vitals are normal, lungs clear, heart RRR, abdominal exam is benign.  Normal neurologic exam.  Patient denies any SI or AVH and did not drink this much alcohol and attempt to harm herself.  Will give IV fluids, Zofran and small dose of Toradol and then p.o. challenge the patient.  Provided patient with large amount of reassurance.   She reports significant improvement in symptoms, she is tolerating p.o. here in the ED and reports she is ready to go home.  Counseled patient on avoiding alcohol especially in large quantities in the future, she continues to deny SI or HI.  Normal vitals and she appears stable for discharge home at this time.  Return precautions discussed.  Encouraged to drink plenty of fluids.  Patient expresses understanding and is in agreement with plan.  Final Clinical Impressions(s) / ED Diagnoses   Final diagnoses:  Alcohol abuse  Nausea  Dehydration  Anxiety    ED Discharge Orders    None       Dartha Lodge, [place] 09/01/18 1911    Vanetta Mulders, MD 09/08/18 [phone]

## 2018-12-02 ENCOUNTER — Other Ambulatory Visit: Payer: Self-pay

## 2018-12-02 ENCOUNTER — Encounter (HOSPITAL_BASED_OUTPATIENT_CLINIC_OR_DEPARTMENT_OTHER): Payer: Self-pay

## 2018-12-02 ENCOUNTER — Emergency Department (HOSPITAL_BASED_OUTPATIENT_CLINIC_OR_DEPARTMENT_OTHER)
Admission: EM | Admit: 2018-12-02 | Discharge: 2018-12-02 | Disposition: A | Discharge status: Home / Self Care | Payer: Medicare PPO | Attending: Emergency Medicine | Admitting: Emergency Medicine

## 2018-12-02 DIAGNOSIS — B9789 Other viral agents as the cause of diseases classified elsewhere: Secondary | ICD-10-CM | POA: Diagnosis not present

## 2018-12-02 DIAGNOSIS — J069 Acute upper respiratory infection, unspecified: Secondary | ICD-10-CM | POA: Diagnosis not present

## 2018-12-02 DIAGNOSIS — Z79899 Other long term (current) drug therapy: Secondary | ICD-10-CM | POA: Diagnosis not present

## 2018-12-02 DIAGNOSIS — F17210 Nicotine dependence, cigarettes, uncomplicated: Secondary | ICD-10-CM | POA: Insufficient documentation

## 2018-12-02 DIAGNOSIS — R0981 Nasal congestion: Secondary | ICD-10-CM | POA: Diagnosis present

## 2018-12-02 MED ORDER — HYDROCOD POLST-CPM POLST ER 10-8 MG/5ML PO SUER: 5.0000 mL | Freq: Two times a day (BID) | ORAL | 0 refills | Status: DC | PRN

## 2018-12-02 MED ORDER — AEROCHAMBER PLUS FLO-VU MISC
1.0000 | Freq: Once | Status: AC
  Administered 2018-12-02: 1
  Filled 2018-12-02: qty 1

## 2018-12-02 NOTE — ED Provider Notes (Signed)
[facility] Provider Note: Lowella DellJ. Lane Promyse Ardito, MD, FACEP  CSN: [id number] MRN: [id number] ARRIVAL: 12/02/18 at 0129 ROOM: MH01/MH01   CHIEF COMPLAINT  URI   HISTORY OF PRESENT ILLNESS  12/02/18 1:50 AM Patty Hughes is a 32 y.o. female with a one-week history of cold symptoms.  Specifically she has had nasal congestion, rhinorrhea and cough.  She has been taking Sudafed and Mucinex DM without adequate relief of her cough.  She describes the cough is frequently paroxysmal and severe at times.  It is primarily a dry cough.  She has an albuterol inhaler but has not been using it regularly.   Past Medical History:  Diagnosis Date  . Anxiety   . Bipolar 1 disorder (HCC)   . Borderline personality disorder (HCC)   . Fatty liver   . PTSD (post-traumatic stress disorder)   . Schizoaffective disorder (HCC)     History reviewed. No pertinent surgical history.  History reviewed. No pertinent family history.  Social History   Tobacco Use  . Smoking status: Current Every Day Smoker    Packs/day: 1.50    Types: Cigarettes  . Smokeless tobacco: Never Used  Substance Use Topics  . Alcohol use: Yes    Alcohol/week: 0.0 standard drinks    Comment: weekly  . Drug use: No    Prior to Admission medications   Medication Sig Start Date End Date Taking? Authorizing Provider  albuterol (PROVENTIL HFA;VENTOLIN HFA) 108 (90 Base) MCG/ACT inhaler Inhale 1-2 puffs into the lungs every 6 (six) hours as needed for wheezing or shortness of breath. 02/21/18  Yes Leaphart, Lynann BeaverKenneth T, PA-C  clonazePAM (KLONOPIN) 1 MG tablet Take 1 mg by mouth 3 (three) times daily as needed for anxiety.   Yes [provider]  DULoxetine (CYMBALTA) 30 MG capsule Take 30 mg by mouth daily.   Yes [provider]  QUEtiapine (SEROQUEL) 50 MG tablet Take 50 mg by mouth at bedtime.   Yes [provider]  benzonatate (TESSALON) 100 MG capsule Take 1 capsule (100 mg total) by mouth every  8 (eight) hours. 02/21/18   Rise MuLeaphart, Kenneth T, PA-C  chlorpheniramine-HYDROcodone (TUSSIONEX PENNKINETIC ER) 10-8 MG/5ML SUER Take 5 mLs by mouth every 12 (twelve) hours as needed for cough. 08/20/18   Melene PlanFloyd, Dan, DO  FANAPT 6 MG TABS  08/03/15   [provider]  Lavella LemonsNUVARING 0.12-0.015 MG/24HR vaginal ring  08/30/15   [provider]    Allergies Haldol [haloperidol]; Risperidone and related; and Lamictal [lamotrigine]   REVIEW OF SYSTEMS  Negative except as noted here or in the History of Present Illness.   PHYSICAL EXAMINATION  Initial Vital Signs Blood pressure 117/75, pulse (!) 112, temperature 98.3 F (36.8 C), temperature source Oral, resp. rate (!) 22, height 5\' 4"  (1.626 m), weight 113.4 kg, last menstrual period 11/23/2018, SpO2 95 %.  Examination General: Well-developed, well-nourished female in no acute distress; appearance consistent with age of record HENT: normocephalic; atraumatic; nasal congestion; no pharyngeal erythema or exudate Eyes: pupils equal, round and reactive to light; extraocular muscles intact Neck: supple Heart: regular rate and rhythm Lungs: clear to auscultation bilaterally; dry cough Abdomen: soft; nondistended; nontender; bowel sounds present Extremities: No deformity; full range of motion; pulses normal Neurologic: Awake, alert and oriented; motor function intact in all extremities and symmetric; no facial droop Skin: Warm and dry Psychiatric: Flat affect   RESULTS  Summary of this visit's results, reviewed by myself:   EKG Interpretation  Date/Time:  Ventricular Rate:    PR Interval:    QRS Duration:   QT Interval:    QTC Calculation:   R Axis:     Text Interpretation:        Laboratory Studies: No results found for this or any previous visit (from the past 24 hour(s)). Imaging Studies: No results found.  ED COURSE and MDM  Nursing notes and initial vitals signs, including pulse oximetry, reviewed.  Vitals:    12/02/18 0132 12/02/18 0137 12/02/18 0139  BP: 126/70 117/75   Pulse: (!) 122 (!) 112   Resp: 20 (!) 22   Temp: 98.2 F (36.8 C) 98.3 F (36.8 C)   TempSrc: Oral Oral   SpO2: 95% 95%   Weight: 119 kg  113.4 kg  Height: 5\' 4"  (1.626 m)  5\' 4"  (1.626 m)   We will have respiratory therapy give her an AeroChamber and instruct her in its use.  She was encouraged to use her albuterol inhaler every 2 hours as needed.  She is gotten relief with Tussionex in the past and is requesting a refill.  PROCEDURES    ED DIAGNOSES     ICD-10-CM   1. Viral URI with cough J06.9    B97.89        Shannah Conteh, MD 12/02/18 0201

## 2018-12-02 NOTE — ED Notes (Signed)
PT states understanding of care given, follow up care, and medication prescribed. PT ambulated from ED to car with a steady gait. 

## 2018-12-02 NOTE — ED Triage Notes (Signed)
Pt has had cold symptoms for a week now with running nose and cough. Pt has been taking sudafed and musinex dm. Pt states she came in today due to the cough being so bad.

## 2018-12-18 IMAGING — CR DG CHEST 2V
2 series · 2 of 2 positions shown · non-contrast
Comparison: 12 18 2014

CLINICAL DATA: Cough congestion

EXAM:
CHEST  2 VIEW

[w chest pa]
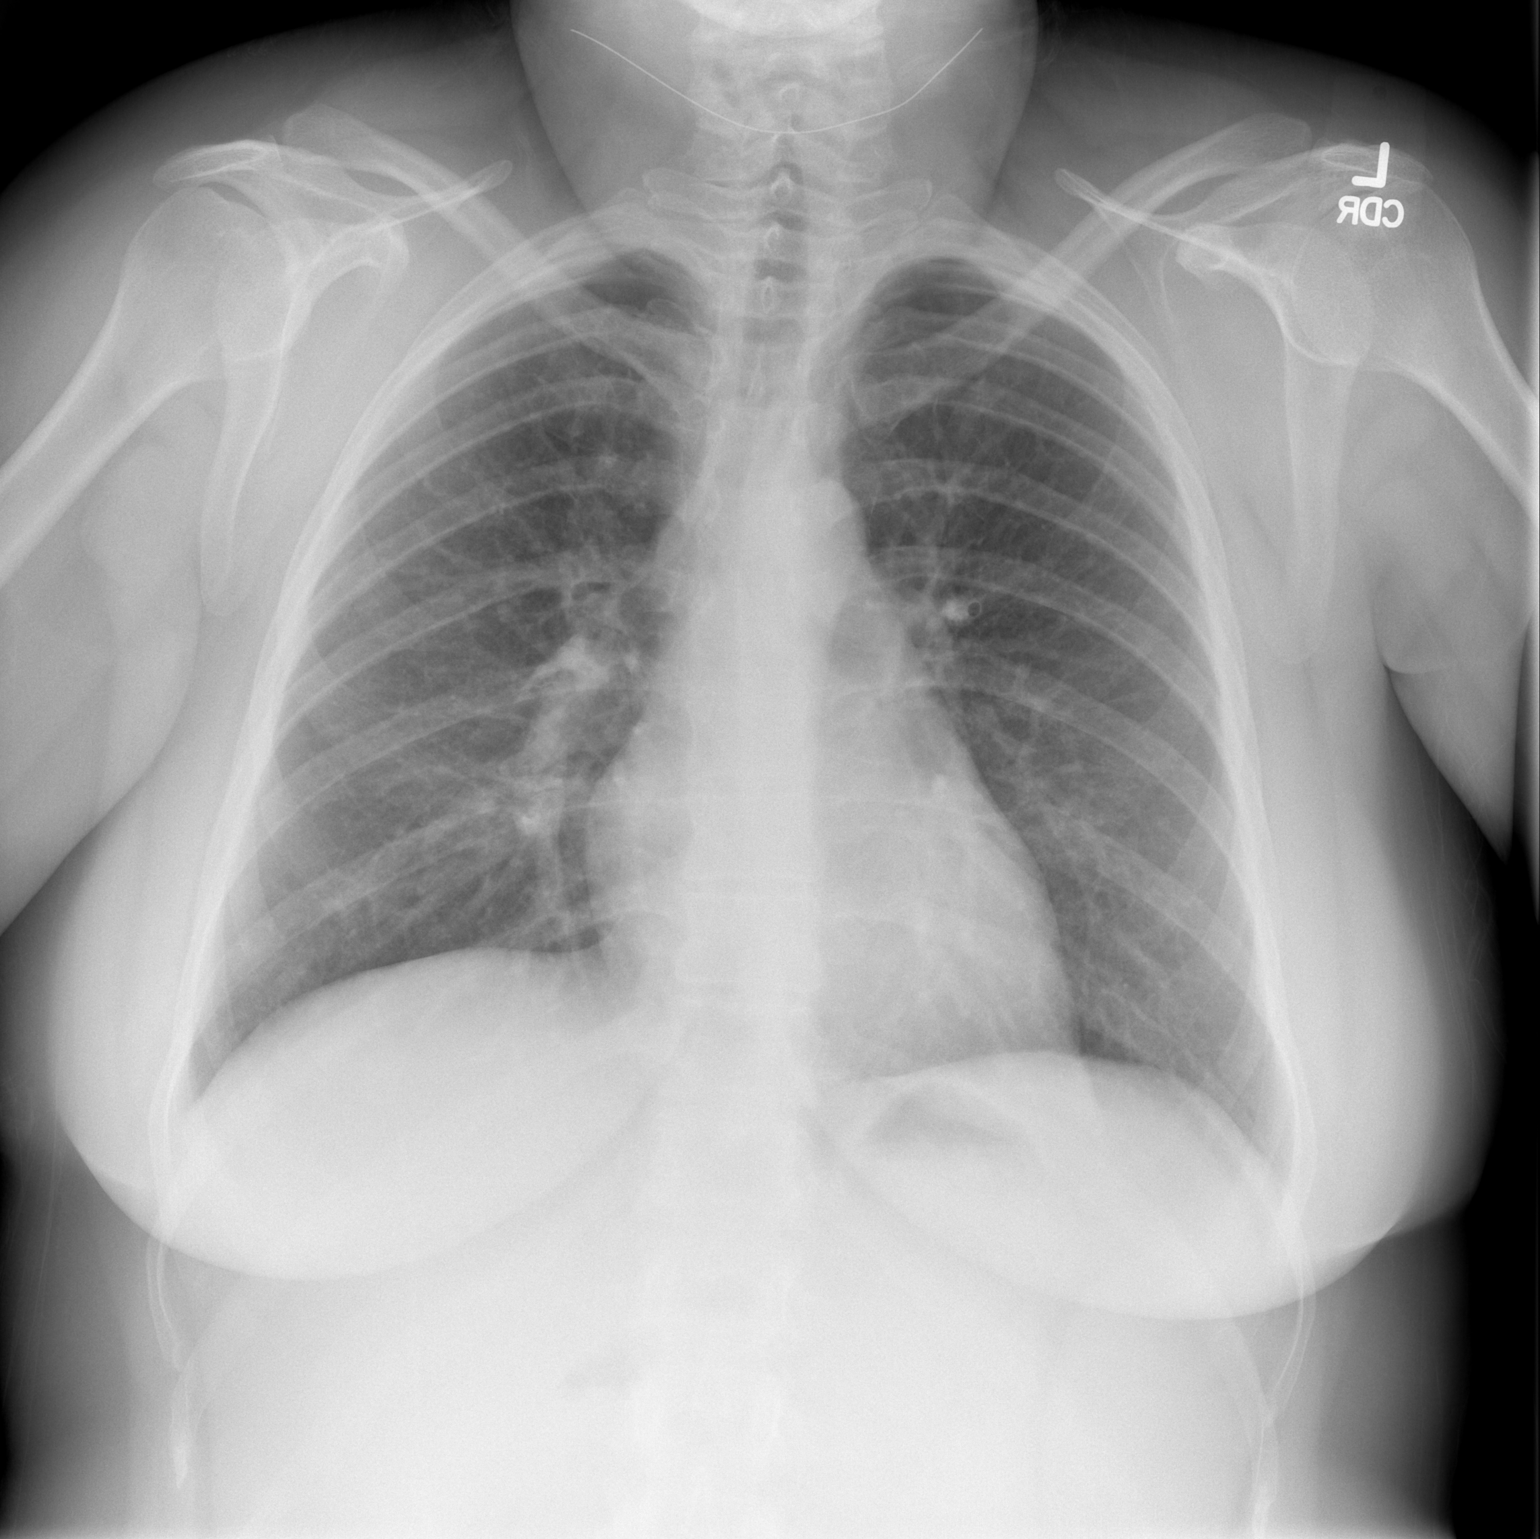

[w chest lat]
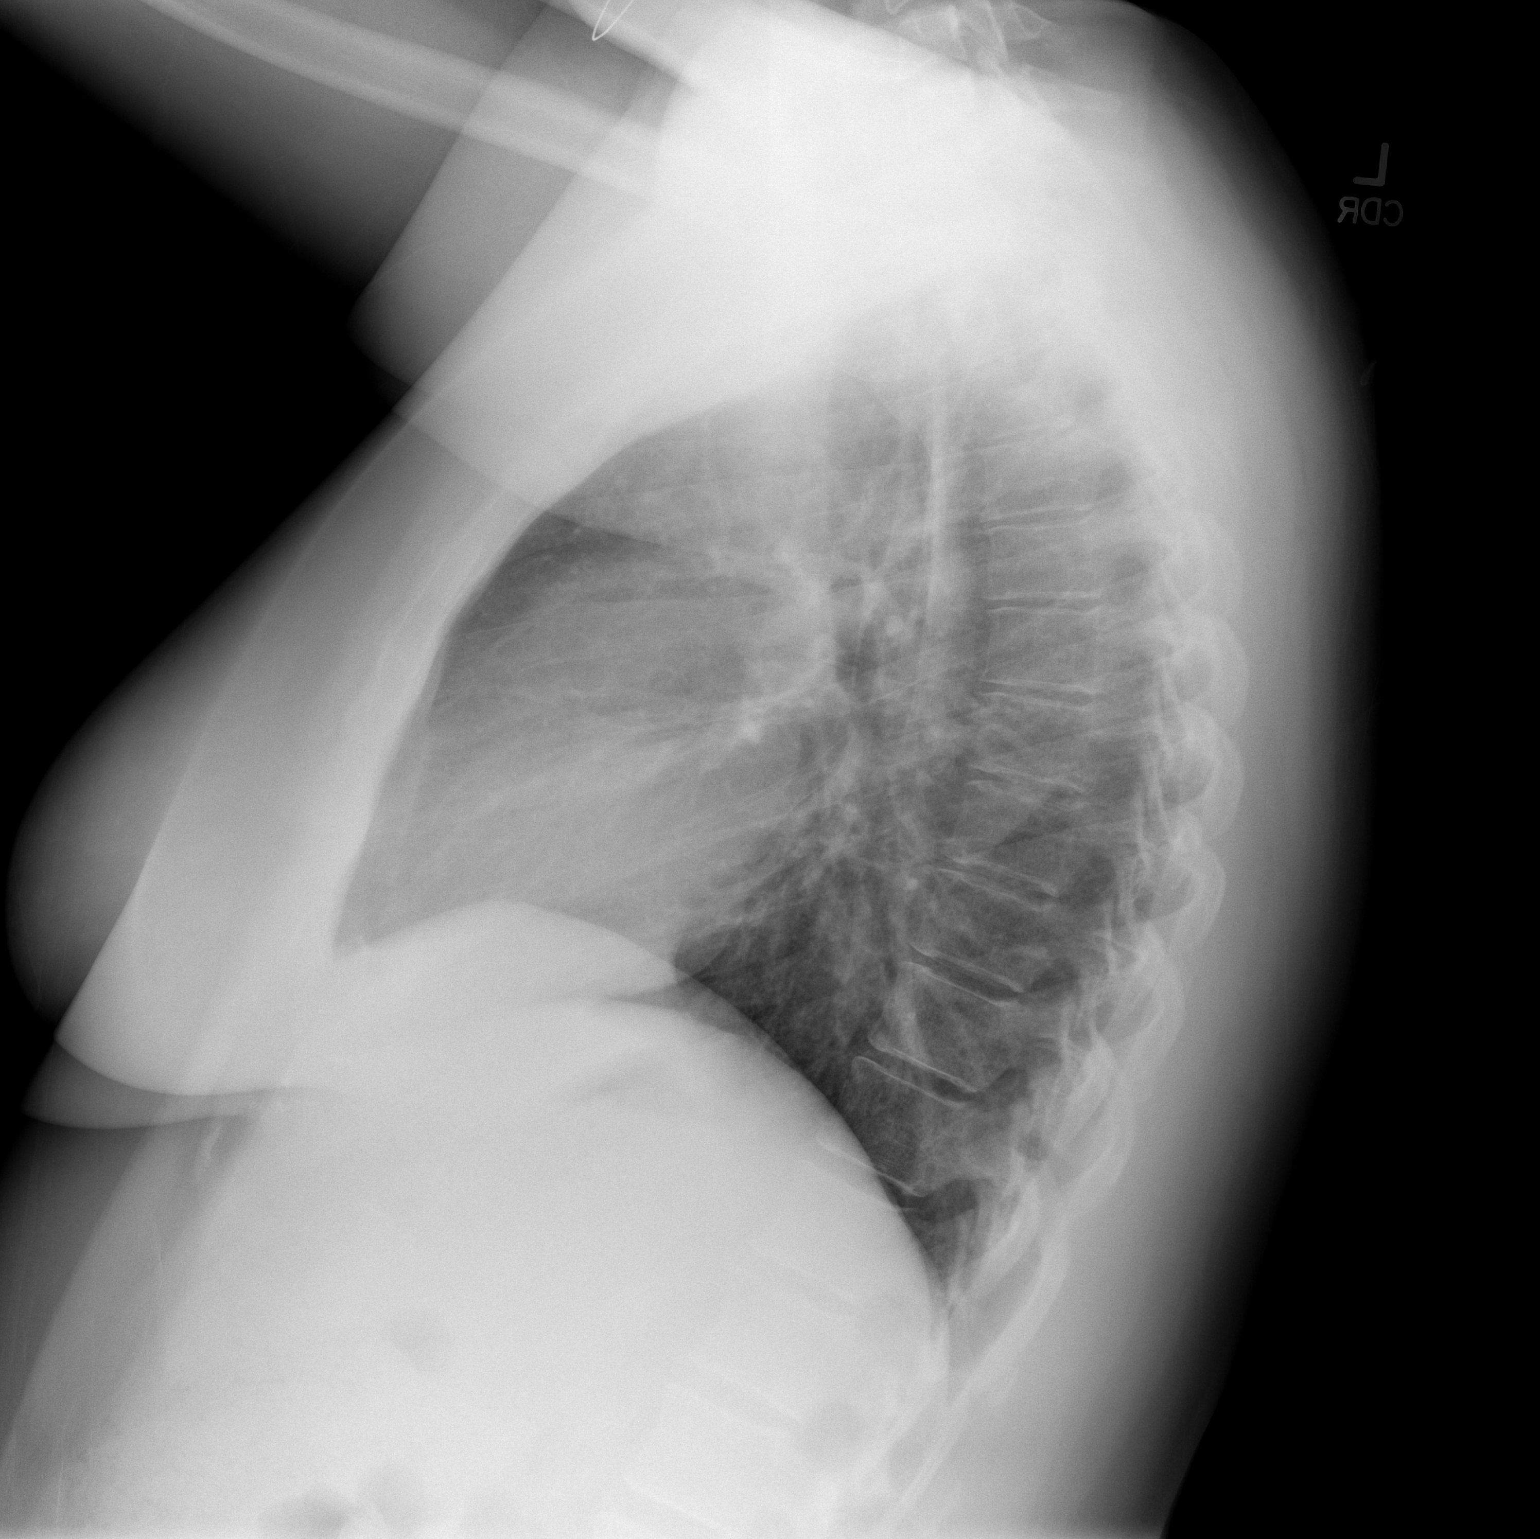

[2 of 2 positions shown; findings below may reference images not displayed]

FINDINGS: The heart size and mediastinal contours are within normal limits.
Both lungs are clear. The visualized skeletal structures are
unremarkable.
IMPRESSION: No active cardiopulmonary disease.

## 2018-12-23 ENCOUNTER — Encounter (HOSPITAL_BASED_OUTPATIENT_CLINIC_OR_DEPARTMENT_OTHER): Payer: Self-pay | Admitting: Adult Health

## 2018-12-23 ENCOUNTER — Other Ambulatory Visit: Payer: Self-pay

## 2018-12-23 ENCOUNTER — Emergency Department (HOSPITAL_BASED_OUTPATIENT_CLINIC_OR_DEPARTMENT_OTHER)
Admission: EM | Admit: 2018-12-23 | Discharge: 2018-12-24 | Disposition: A | Discharge status: Home / Self Care | Payer: Medicare PPO | Attending: Emergency Medicine | Admitting: Emergency Medicine

## 2018-12-23 DIAGNOSIS — F319 Bipolar disorder, unspecified: Secondary | ICD-10-CM | POA: Diagnosis not present

## 2018-12-23 DIAGNOSIS — F259 Schizoaffective disorder, unspecified: Secondary | ICD-10-CM | POA: Diagnosis not present

## 2018-12-23 DIAGNOSIS — R1011 Right upper quadrant pain: Secondary | ICD-10-CM

## 2018-12-23 DIAGNOSIS — F419 Anxiety disorder, unspecified: Secondary | ICD-10-CM | POA: Diagnosis not present

## 2018-12-23 DIAGNOSIS — Z79899 Other long term (current) drug therapy: Secondary | ICD-10-CM | POA: Diagnosis not present

## 2018-12-23 DIAGNOSIS — F17210 Nicotine dependence, cigarettes, uncomplicated: Secondary | ICD-10-CM | POA: Diagnosis not present

## 2018-12-23 LAB — CBC WITH DIFFERENTIAL/PLATELET
Abs Immature Granulocytes: 0.03 10*3/uL (ref 0.00–0.07)
BASOS PCT: 1 %
Basophils Absolute: 0 10*3/uL (ref 0.0–0.1)
EOS PCT: 2 %
Eosinophils Absolute: 0.1 10*3/uL (ref 0.0–0.5)
HCT: 39.6 % (ref 36.0–46.0)
HEMOGLOBIN: 12.9 g/dL (ref 12.0–15.0)
Immature Granulocytes: 0 %
LYMPHS PCT: 49 %
Lymphs Abs: 4.1 10*3/uL — ABNORMAL HIGH (ref 0.7–4.0)
MCH: 29.5 pg (ref 26.0–34.0)
MCHC: 32.6 g/dL (ref 30.0–36.0)
MCV: 90.4 fL (ref 80.0–100.0)
Monocytes Absolute: 0.6 10*3/uL (ref 0.1–1.0)
Monocytes Relative: 7 %
NRBC: 0 % (ref 0.0–0.2)
Neutro Abs: 3.4 10*3/uL (ref 1.7–7.7)
Neutrophils Relative %: 41 %
Platelets: 201 10*3/uL (ref 150–400)
RBC: 4.38 MIL/uL (ref 3.87–5.11)
RDW: 13 % (ref 11.5–15.5)
WBC: 8.3 10*3/uL (ref 4.0–10.5)

## 2018-12-23 LAB — URINALYSIS, ROUTINE W REFLEX MICROSCOPIC
Bilirubin Urine: NEGATIVE
Glucose, UA: NEGATIVE mg/dL
KETONES UR: NEGATIVE mg/dL
Nitrite: NEGATIVE
PH: 6 (ref 5.0–8.0)
Protein, ur: NEGATIVE mg/dL
SPECIFIC GRAVITY, URINE: 1.025 (ref 1.005–1.030)

## 2018-12-23 LAB — URINALYSIS, MICROSCOPIC (REFLEX)

## 2018-12-23 LAB — PREGNANCY, URINE: PREG TEST UR: NEGATIVE

## 2018-12-23 NOTE — ED Provider Notes (Signed)
[facility] Provider Note: [name], MD, FACEP  CSN: [id number] MRN: [id number] ARRIVAL: 12/23/18 at 2057 ROOM: [facility]03/[facility]03   CHIEF COMPLAINT  Abdominal Pain   HISTORY OF PRESENT ILLNESS  12/23/18 11:09 PM Patty Hughes is a 32 y.o. female with about a one-month history of intermittent right upper quadrant pain.  She rates the pain as an 8 out of 10 at its worse, none presently.  Pain is worse with movement or palpation.  She is not sure if it is brought on by eating.  She denies associated nausea, vomiting or diarrhea.  She admits to drinking alcohol for the past several days.   Past Medical History:  Diagnosis Date  . Anxiety   . Bipolar 1 disorder (HCC)   . Borderline personality disorder (HCC)   . Fatty liver   . PTSD (post-traumatic stress disorder)   . Schizoaffective disorder (HCC)     History reviewed. No pertinent surgical history.  History reviewed. No pertinent family history.  Social History   Tobacco Use  . Smoking status: Current Every Day Smoker    Packs/day: 1.50    Types: Cigarettes  . Smokeless tobacco: Never Used  Substance Use Topics  . Alcohol use: Yes    Alcohol/week: 0.0 standard drinks    Comment: weekly  . Drug use: No    Prior to Admission medications   Medication Sig Start Date End Date Taking? Authorizing Provider  albuterol (PROVENTIL HFA;VENTOLIN HFA) 108 (90 Base) MCG/ACT inhaler Inhale 1-2 puffs into the lungs every 6 (six) hours as needed for wheezing or shortness of breath. 02/21/18   Rise Mu, PA-C  benzonatate (TESSALON) 100 MG capsule Take 1 capsule (100 mg total) by mouth every 8 (eight) hours. 02/21/18   Rise Mu, PA-C  chlorpheniramine-HYDROcodone (TUSSIONEX PENNKINETIC ER) 10-8 MG/5ML SUER Take 5 mLs by mouth every 12 (twelve) hours as needed for cough. 12/02/18   Cerria Randhawa, MD  clonazePAM (KLONOPIN) 1 MG tablet Take 1 mg by mouth 3 (three) times daily as needed for anxiety.     [provider]  DULoxetine (CYMBALTA) 30 MG capsule Take 30 mg by mouth daily.    [provider]  FANAPT 6 MG TABS  08/03/15   [provider]  NUVARING 0.12-0.015 MG/24HR vaginal ring  08/30/15   [provider]  QUEtiapine (SEROQUEL) 50 MG tablet Take 50 mg by mouth at bedtime.    [provider]    Allergies Haldol [haloperidol]; Risperidone and related; and Lamictal [lamotrigine]   REVIEW OF SYSTEMS  Negative except as noted here or in the History of Present Illness.   PHYSICAL EXAMINATION  Initial Vital Signs Blood pressure 129/90, pulse 92, temperature 98.9 F (37.2 C), temperature source Oral, resp. rate 20, height 5\' 4"  (1.626 m), weight 113.4 kg, last menstrual period 11/23/2018, SpO2 96 %.  Examination General: Well-developed, obese female in no acute distress; appearance consistent with age of record HENT: normocephalic; atraumatic Eyes: pupils equal, round and reactive to light; extraocular muscles intact Neck: supple Heart: regular rate and rhythm Lungs: clear to auscultation bilaterally Abdomen: soft; nondistended; right upper quadrant tenderness; no masses or hepatosplenomegaly; bowel sounds present; unable to view gallbladder with bedside ultrasound Extremities: No deformity; full range of motion; pulses normal Neurologic: Awake, alert and oriented; motor function intact in all extremities and symmetric; no facial droop Skin: Warm and dry Psychiatric: Normal mood and affect   RESULTS  Summary of this visit's results, reviewed by  myself:   EKG Interpretation  Date/Time:    Ventricular Rate:    PR Interval:    QRS Duration:   QT Interval:    QTC Calculation:   R Axis:     Text Interpretation:        Laboratory Studies: Results for orders placed or performed during the hospital encounter of 12/23/18 (from the past 24 hour(s))  Urinalysis, Routine w reflex microscopic     Status: Abnormal   Collection Time:  12/23/18 11:37 PM  Result Value Ref Range   Color, Urine YELLOW YELLOW   APPearance CLEAR CLEAR   Specific Gravity, Urine 1.025 1.005 - 1.030   pH 6.0 5.0 - 8.0   Glucose, UA NEGATIVE NEGATIVE mg/dL   Hgb urine dipstick TRACE (A) NEGATIVE   Bilirubin Urine NEGATIVE NEGATIVE   Ketones, ur NEGATIVE NEGATIVE mg/dL   Protein, ur NEGATIVE NEGATIVE mg/dL   Nitrite NEGATIVE NEGATIVE   Leukocytes, UA TRACE (A) NEGATIVE  Pregnancy, urine     Status: None   Collection Time: 12/23/18 11:37 PM  Result Value Ref Range   Preg Test, Ur NEGATIVE NEGATIVE  CBC with Differential/Platelet     Status: Abnormal   Collection Time: 12/23/18 11:37 PM  Result Value Ref Range   WBC 8.3 4.0 - 10.5 K/uL   RBC 4.38 3.87 - 5.11 MIL/uL   Hemoglobin 12.9 12.0 - 15.0 g/dL   HCT 96.039.6 45.436.0 - 09.846.0 %   MCV 90.4 80.0 - 100.0 fL   MCH 29.5 26.0 - 34.0 pg   MCHC 32.6 30.0 - 36.0 g/dL   RDW 11.913.0 14.711.5 - 82.915.5 %   Platelets 201 150 - 400 K/uL   nRBC 0.0 0.0 - 0.2 %   Neutrophils Relative % 41 %   Neutro Abs 3.4 1.7 - 7.7 K/uL   Lymphocytes Relative 49 %   Lymphs Abs 4.1 (H) 0.7 - 4.0 K/uL   Monocytes Relative 7 %   Monocytes Absolute 0.6 0.1 - 1.0 K/uL   Eosinophils Relative 2 %   Eosinophils Absolute 0.1 0.0 - 0.5 K/uL   Basophils Relative 1 %   Basophils Absolute 0.0 0.0 - 0.1 K/uL   Immature Granulocytes 0 %   Abs Immature Granulocytes 0.03 0.00 - 0.07 K/uL  Comprehensive metabolic panel     Status: Abnormal   Collection Time: 12/23/18 11:37 PM  Result Value Ref Range   Sodium 137 135 - 145 mmol/L   Potassium 3.7 3.5 - 5.1 mmol/L   Chloride 105 98 - 111 mmol/L   CO2 24 22 - 32 mmol/L   Glucose, Bld 106 (H) 70 - 99 mg/dL   BUN 10 6 - 20 mg/dL   Creatinine, Ser 5.620.95 0.44 - 1.00 mg/dL   Calcium 8.7 (L) 8.9 - 10.3 mg/dL   Total Protein 6.9 6.5 - 8.1 g/dL   Albumin 3.6 3.5 - 5.0 g/dL   AST 93 (H) 15 - 41 U/L   ALT 153 (H) 0 - 44 U/L   Alkaline Phosphatase 81 38 - 126 U/L   Total Bilirubin 0.3 0.3 - 1.2  mg/dL   GFR calc non Af Amer >60 >60 mL/min   GFR calc Af Amer >60 >60 mL/min   Anion gap 8 5 - 15  Lipase, blood     Status: None   Collection Time: 12/23/18 11:37 PM  Result Value Ref Range   Lipase 44 11 - 51 U/L  Urinalysis, Microscopic (reflex)     Status: Abnormal  Collection Time: 12/23/18 11:37 PM  Result Value Ref Range   RBC / HPF 0-5 0 - 5 RBC/hpf   WBC, UA 6-10 0 - 5 WBC/hpf   Bacteria, UA MANY (A) NONE SEEN   Squamous Epithelial / LPF 0-5 0 - 5   Mucus PRESENT    Imaging Studies: No results found.  ED COURSE and MDM  Nursing notes and initial vitals signs, including pulse oximetry, reviewed.  Vitals:   12/23/18 2109 12/23/18 2113 12/23/18 2338  BP: 129/90  132/79  Pulse: 92  92  Resp: 20  18  Temp: 98.9 F (37.2 C)    TempSrc: Oral    SpO2: 96%  99%  Weight:  113.4 kg   Height:  5\' 4"  (1.626 m)    12:24 AM Patient sleeping peacefully.  States pain is improved but not zero.  I suspect gallbladder disease and we will have her return later today for an ultrasound.  PROCEDURES    ED DIAGNOSES     ICD-10-CM   1. Right upper quadrant abdominal pain R10.11        Patty Hughes, Patty RuizJohn, MD 12/24/18 0025

## 2018-12-23 NOTE — ED Notes (Signed)
Pt reports bad GERD/ RUQ x 1 month - tonight pain feels worse

## 2018-12-23 NOTE — ED Triage Notes (Signed)
Presents with one month of URQ abdominal pain that used to come and go and is constant today. She denies nausea, vomting and diarrhea. She says the pain is worse with bending over non tender to palpation. "It is swollen and you can feel it" It is a dull pain.

## 2018-12-24 LAB — COMPREHENSIVE METABOLIC PANEL
ALK PHOS: 81 U/L (ref 38–126)
ALT: 153 U/L — ABNORMAL HIGH (ref 0–44)
AST: 93 U/L — AB (ref 15–41)
Albumin: 3.6 g/dL (ref 3.5–5.0)
Anion gap: 8 (ref 5–15)
BILIRUBIN TOTAL: 0.3 mg/dL (ref 0.3–1.2)
BUN: 10 mg/dL (ref 6–20)
CALCIUM: 8.7 mg/dL — AB (ref 8.9–10.3)
CO2: 24 mmol/L (ref 22–32)
Chloride: 105 mmol/L (ref 98–111)
Creatinine, Ser: 0.95 mg/dL (ref 0.44–1.00)
GFR calc Af Amer: >60 mL/min (ref >60)
GFR calc non Af Amer: >60 mL/min (ref >60)
GLUCOSE: 106 mg/dL — AB (ref 70–99)
POTASSIUM: 3.7 mmol/L (ref 3.5–5.1)
Sodium: 137 mmol/L (ref 135–145)
TOTAL PROTEIN: 6.9 g/dL (ref 6.5–8.1)

## 2018-12-24 LAB — LIPASE, BLOOD: LIPASE: 44 U/L (ref 11–51)

## 2018-12-24 MED ORDER — PANTOPRAZOLE SODIUM 40 MG PO TBEC
40.0000 mg | DELAYED_RELEASE_TABLET | Freq: Once | ORAL | Status: AC
  Administered 2018-12-24: 40 mg via ORAL
  Filled 2018-12-24: qty 1

## 2018-12-24 NOTE — ED Notes (Signed)
Pt not in room to receive d/c paperwork. CT tech scheduled ultrasound for Friday at 130. Pt voiced understanding to CT tech.

## 2018-12-25 ENCOUNTER — Ambulatory Visit (HOSPITAL_BASED_OUTPATIENT_CLINIC_OR_DEPARTMENT_OTHER): Admit: 2018-12-25 | Payer: Medicare PPO

## 2018-12-26 ENCOUNTER — Other Ambulatory Visit: Payer: Self-pay

## 2018-12-26 ENCOUNTER — Emergency Department (HOSPITAL_BASED_OUTPATIENT_CLINIC_OR_DEPARTMENT_OTHER)
Admission: EM | Admit: 2018-12-26 | Discharge: 2018-12-26 | Disposition: A | Discharge status: Home / Self Care | Payer: Medicare PPO | Attending: Emergency Medicine | Admitting: Emergency Medicine

## 2018-12-26 ENCOUNTER — Emergency Department (HOSPITAL_BASED_OUTPATIENT_CLINIC_OR_DEPARTMENT_OTHER): Payer: Medicare PPO

## 2018-12-26 ENCOUNTER — Encounter (HOSPITAL_BASED_OUTPATIENT_CLINIC_OR_DEPARTMENT_OTHER): Payer: Self-pay | Admitting: *Deleted

## 2018-12-26 DIAGNOSIS — K760 Fatty (change of) liver, not elsewhere classified: Secondary | ICD-10-CM | POA: Diagnosis not present

## 2018-12-26 DIAGNOSIS — R1011 Right upper quadrant pain: Secondary | ICD-10-CM | POA: Insufficient documentation

## 2018-12-26 DIAGNOSIS — R109 Unspecified abdominal pain: Secondary | ICD-10-CM | POA: Diagnosis present

## 2018-12-26 DIAGNOSIS — F17210 Nicotine dependence, cigarettes, uncomplicated: Secondary | ICD-10-CM | POA: Diagnosis not present

## 2018-12-26 DIAGNOSIS — Z79899 Other long term (current) drug therapy: Secondary | ICD-10-CM | POA: Diagnosis not present

## 2018-12-26 LAB — URINALYSIS, ROUTINE W REFLEX MICROSCOPIC
Bilirubin Urine: NEGATIVE
GLUCOSE, UA: NEGATIVE mg/dL
Hgb urine dipstick: NEGATIVE
Ketones, ur: NEGATIVE mg/dL
Nitrite: NEGATIVE
Protein, ur: NEGATIVE mg/dL
SPECIFIC GRAVITY, URINE: 1.025 (ref 1.005–1.030)
pH: 6 (ref 5.0–8.0)

## 2018-12-26 LAB — COMPREHENSIVE METABOLIC PANEL
ALT: 119 U/L — AB (ref 0–44)
AST: 46 U/L — ABNORMAL HIGH (ref 15–41)
Albumin: 4 g/dL (ref 3.5–5.0)
Alkaline Phosphatase: 82 U/L (ref 38–126)
Anion gap: 8 (ref 5–15)
BUN: 11 mg/dL (ref 6–20)
CHLORIDE: 105 mmol/L (ref 98–111)
CO2: 25 mmol/L (ref 22–32)
CREATININE: 0.91 mg/dL (ref 0.44–1.00)
Calcium: 8.9 mg/dL (ref 8.9–10.3)
GFR calc non Af Amer: >60 mL/min (ref >60)
Glucose, Bld: 102 mg/dL — ABNORMAL HIGH (ref 70–99)
Potassium: 3.6 mmol/L (ref 3.5–5.1)
SODIUM: 138 mmol/L (ref 135–145)
Total Bilirubin: 0.4 mg/dL (ref 0.3–1.2)
Total Protein: 7.4 g/dL (ref 6.5–8.1)

## 2018-12-26 LAB — CBC
HCT: 42.4 % (ref 36.0–46.0)
Hemoglobin: 13.4 g/dL (ref 12.0–15.0)
MCH: 28.9 pg (ref 26.0–34.0)
MCHC: 31.6 g/dL (ref 30.0–36.0)
MCV: 91.4 fL (ref 80.0–100.0)
NRBC: 0 % (ref 0.0–0.2)
Platelets: 201 10*3/uL (ref 150–400)
RBC: 4.64 MIL/uL (ref 3.87–5.11)
RDW: 13.1 % (ref 11.5–15.5)
WBC: 11.1 10*3/uL — ABNORMAL HIGH (ref 4.0–10.5)

## 2018-12-26 LAB — URINALYSIS, MICROSCOPIC (REFLEX)

## 2018-12-26 LAB — LIPASE, BLOOD: Lipase: 34 U/L (ref 11–51)

## 2018-12-26 LAB — TROPONIN I

## 2018-12-26 LAB — PREGNANCY, URINE: Preg Test, Ur: NEGATIVE

## 2018-12-26 NOTE — ED Triage Notes (Signed)
Pt reports that she has an outpatient US ordered for tomorrow of her gallbladder but 'I'm scared and dont want to wait'. States RUQ pain. Denies N/V/D.

## 2018-12-26 NOTE — ED Provider Notes (Signed)
[facility] EMERGENCY DEPARTMENT Provider Note   CSN: [id number] Arrival date & time: 12/26/18  1548     History   Chief Complaint Chief Complaint  Patient presents with  . Abdominal Pain    HPI Patty Hughes is a 32 y.o. female.  The history is provided by the patient. No language interpreter was used.  Abdominal Pain     Patty Hughes is a 32 y.o. female who presents to the Emergency Department complaining of abdominal pain. She presents to the emergency department for follow-up on abdominal pain. She has been experiencing intermittent right upper quadrant pain for the last several days. She was seen in the emergency department on December 25 and had an outpatient ultrasound scheduled. She presents today to have this ultrasound performed. She states that the pain is waxing and waning. On Christmas Day it was present all day. Pain is worse with activities and at times comes with rest. She denies any associated fevers, shortness of breath, nausea, vomiting, diaphoresis, leg swelling or pain. She has a history of bipolar disorder, schizophrenia, hyperlipidemia. No history of blood clots, coronary artery disease. She does not smoke. Currently she is pain free. No change in her symptoms with meals. Past Medical History:  Diagnosis Date  . Anxiety   . Bipolar 1 disorder (HCC)   . Borderline personality disorder (HCC)   . Fatty liver   . PTSD (post-traumatic stress disorder)   . Schizoaffective disorder [facility](HCC)     Patient Active Problem List   Diagnosis Date Noted  . Achilles tendonitis, bilateral 09/06/2015  . Equinus deformity of foot, acquired 09/06/2015  . Metatarsal deformity 09/06/2015    History reviewed. No pertinent surgical history.   OB History   No obstetric history on file.      Home Medications    Prior to Admission medications   Medication Sig Start Date End Date Taking? Authorizing Provider  albuterol (PROVENTIL HFA;VENTOLIN HFA) 108 (90  Base) MCG/ACT inhaler Inhale 1-2 puffs into the lungs every 6 (six) hours as needed for wheezing or shortness of breath. 02/21/18   Rise MuLeaphart, Kenneth T, PA-C  benzonatate (TESSALON) 100 MG capsule Take 1 capsule (100 mg total) by mouth every 8 (eight) hours. 02/21/18   Rise MuLeaphart, Kenneth T, PA-C  chlorpheniramine-HYDROcodone (TUSSIONEX PENNKINETIC ER) 10-8 MG/5ML SUER Take 5 mLs by mouth every 12 (twelve) hours as needed for cough. 12/02/18   Molpus, John, MD  clonazePAM (KLONOPIN) 1 MG tablet Take 1 mg by mouth 3 (three) times daily as needed for anxiety.    [provider]  DULoxetine (CYMBALTA) 30 MG capsule Take 30 mg by mouth daily.    [provider]  FANAPT 6 MG TABS  08/03/15   [provider]  NUVARING 0.12-0.015 MG/24HR vaginal ring  08/30/15   [provider]  QUEtiapine (SEROQUEL) 50 MG tablet Take 50 mg by mouth at bedtime.    [provider]    Family History History reviewed. No pertinent family history.  Social History Social History   Tobacco Use  . Smoking status: Current Every Day Smoker    Packs/day: 1.50    Types: Cigarettes  . Smokeless tobacco: Never Used  Substance Use Topics  . Alcohol use: Yes    Alcohol/week: 0.0 standard drinks    Comment: weekly  . Drug use: No     Allergies   Haldol [haloperidol]; Risperidone and related; and Lamictal [lamotrigine]   Review of Systems Review of Systems  Gastrointestinal:  Positive for abdominal pain.  All other systems reviewed and are negative.    Physical Exam Updated Vital Signs BP 121/80   Pulse 94   Temp 98.6 F (37 C) (Oral)   Resp 19   LMP 12/17/2018 (Approximate)   SpO2 99%   Physical Exam Vitals signs and nursing note reviewed.  Constitutional:      Appearance: She is well-developed.  HENT:     Head: Normocephalic and atraumatic.  Cardiovascular:     Rate and Rhythm: Normal rate and regular rhythm.     Heart sounds: No murmur.  Pulmonary:      Effort: Pulmonary effort is normal. No respiratory distress.     Breath sounds: Normal breath sounds.  Abdominal:     Palpations: Abdomen is soft.     Tenderness: There is no abdominal tenderness. There is no guarding or rebound.  Musculoskeletal:        General: No swelling or tenderness.  Skin:    General: Skin is warm and dry.  Neurological:     Mental Status: She is alert and oriented to person, place, and time.  Psychiatric:        Behavior: Behavior normal.     Comments: Flat affect      ED Treatments / Results  Labs (all labs ordered are listed, but only abnormal results are displayed) Labs Reviewed  COMPREHENSIVE METABOLIC PANEL - Abnormal; Notable for the following components:      Result Value   Glucose, Bld 102 (*)    AST 46 (*)    ALT 119 (*)    All other components within normal limits  CBC - Abnormal; Notable for the following components:   WBC 11.1 (*)    All other components within normal limits  URINALYSIS, ROUTINE W REFLEX MICROSCOPIC - Abnormal; Notable for the following components:   Leukocytes, UA TRACE (*)    All other components within normal limits  URINALYSIS, MICROSCOPIC (REFLEX) - Abnormal; Notable for the following components:   Bacteria, UA FEW (*)    All other components within normal limits  LIPASE, BLOOD  PREGNANCY, URINE  TROPONIN I    EKG None  Radiology [place] Abdomen Limited Ruq  Result Date: 12/26/2018 CLINICAL DATA:  32 year old with 1 month history of RIGHT UPPER QUADRANT abdominal pain and intermittent diarrhea. EXAM: ULTRASOUND ABDOMEN LIMITED RIGHT UPPER QUADRANT COMPARISON:  Report of complete abdominal ultrasound 01/23/2015 which described hepatic steatosis or hepatocellular disease. FINDINGS: Gallbladder: No shadowing gallstones or echogenic sludge. No gallbladder wall thickening or pericholecystic fluid. Negative sonographic Murphy sign according to the ultrasound technologist. Common bile duct: Diameter: 2 mm. Liver:  Diffusely increased and coarsened echotexture without focal hepatic parenchymal abnormality. Portal vein is patent on color Doppler imaging with normal direction of blood flow towards the liver. IMPRESSION: 1. Diffuse hepatic steatosis and/or hepatocellular disease without focal hepatic parenchymal abnormality. 2. Otherwise normal examination. Electronically Signed   By: Hulan Saashomas  Lawrence M.D.   On: 12/26/2018 16:57    Procedures Procedures (including critical care time)  Medications Ordered in ED Medications - No data to display   Initial Impression / Assessment and Plan / ED Course  I have reviewed the triage vital signs and the nursing notes.  Pertinent labs & imaging results that were available during my care of the patient were reviewed by me and considered in my medical decision making (see chart for details).     Patient here for evaluation of right upper quadrant pain that was present  a few days ago, currently resolved. Pain was exertional in nature. Labs demonstrate stable elevation in her transaminases. Presentation is not consistent with ACS, PE. Ultrasound demonstrates fatty liver, no evidence of cholecystitis or cholelithiasis. Discussed with patient home care for right upper quadrant pain, currently resolved. Discussed importance of outpatient follow-up and return precautions.  Final Clinical Impressions(s) / ED Diagnoses   Final diagnoses:  RUQ pain  Fatty liver    ED Discharge Orders    None       Tilden Fossa, MD 12/26/18 2347

## 2018-12-26 NOTE — ED Notes (Signed)
ED Provider at bedside. 

## 2018-12-27 ENCOUNTER — Ambulatory Visit (HOSPITAL_BASED_OUTPATIENT_CLINIC_OR_DEPARTMENT_OTHER): Admission: RE | Admit: 2018-12-27 | Payer: Medicare PPO | Source: Ambulatory Visit

## 2020-05-20 ENCOUNTER — Other Ambulatory Visit: Payer: Self-pay

## 2020-05-20 ENCOUNTER — Emergency Department (HOSPITAL_BASED_OUTPATIENT_CLINIC_OR_DEPARTMENT_OTHER)
Admission: EM | Admit: 2020-05-20 | Discharge: 2020-05-20 | Disposition: A | Discharge status: Home / Self Care | Payer: Medicare Other | Attending: Emergency Medicine | Admitting: Emergency Medicine

## 2020-05-20 DIAGNOSIS — Y929 Unspecified place or not applicable: Secondary | ICD-10-CM | POA: Diagnosis not present

## 2020-05-20 DIAGNOSIS — Y9389 Activity, other specified: Secondary | ICD-10-CM | POA: Insufficient documentation

## 2020-05-20 DIAGNOSIS — Z79899 Other long term (current) drug therapy: Secondary | ICD-10-CM | POA: Insufficient documentation

## 2020-05-20 DIAGNOSIS — Z888 Allergy status to other drugs, medicaments and biological substances status: Secondary | ICD-10-CM | POA: Insufficient documentation

## 2020-05-20 DIAGNOSIS — N3090 Cystitis, unspecified without hematuria: Secondary | ICD-10-CM | POA: Diagnosis not present

## 2020-05-20 DIAGNOSIS — F17210 Nicotine dependence, cigarettes, uncomplicated: Secondary | ICD-10-CM | POA: Insufficient documentation

## 2020-05-20 DIAGNOSIS — Z202 Contact with and (suspected) exposure to infections with a predominantly sexual mode of transmission: Secondary | ICD-10-CM | POA: Insufficient documentation

## 2020-05-20 DIAGNOSIS — T162XXA Foreign body in left ear, initial encounter: Secondary | ICD-10-CM | POA: Diagnosis not present

## 2020-05-20 DIAGNOSIS — Y999 Unspecified external cause status: Secondary | ICD-10-CM | POA: Diagnosis not present

## 2020-05-20 DIAGNOSIS — X58XXXA Exposure to other specified factors, initial encounter: Secondary | ICD-10-CM | POA: Insufficient documentation

## 2020-05-20 DIAGNOSIS — Z7689 Persons encountering health services in other specified circumstances: Secondary | ICD-10-CM

## 2020-05-20 DIAGNOSIS — R300 Dysuria: Secondary | ICD-10-CM | POA: Diagnosis present

## 2020-05-20 LAB — URINALYSIS, MICROSCOPIC (REFLEX): WBC, UA: >50 WBC/hpf (ref 0–5)

## 2020-05-20 LAB — URINALYSIS, ROUTINE W REFLEX MICROSCOPIC
Glucose, UA: NEGATIVE mg/dL
Hgb urine dipstick: NEGATIVE
Ketones, ur: 15 mg/dL — AB
Nitrite: POSITIVE — AB
Protein, ur: 30 mg/dL — AB
Specific Gravity, Urine: >1.03 — ABNORMAL HIGH (ref 1.005–1.030)
pH: 6 (ref 5.0–8.0)

## 2020-05-20 LAB — PREGNANCY, URINE: Preg Test, Ur: NEGATIVE

## 2020-05-20 LAB — WET PREP, GENITAL
Clue Cells Wet Prep HPF POC: NONE SEEN
Sperm: NONE SEEN
Trich, Wet Prep: NONE SEEN
Yeast Wet Prep HPF POC: NONE SEEN

## 2020-05-20 MED ORDER — DOXYCYCLINE HYCLATE 100 MG PO CAPS
100.0000 mg | ORAL_CAPSULE | Freq: Two times a day (BID) | ORAL | 0 refills | Status: AC
Start: 2020-05-20 — End: 2020-05-27

## 2020-05-20 MED ORDER — CEFTRIAXONE SODIUM 500 MG IJ SOLR
500.0000 mg | Freq: Once | INTRAMUSCULAR | Status: AC
  Administered 2020-05-20: 500 mg via INTRAMUSCULAR
  Filled 2020-05-20: qty 500

## 2020-05-20 MED ORDER — CEPHALEXIN 500 MG PO CAPS
500.0000 mg | ORAL_CAPSULE | Freq: Four times a day (QID) | ORAL | 0 refills | Status: DC
Start: 2020-05-20 — End: 2020-06-26

## 2020-05-20 NOTE — ED Triage Notes (Signed)
Pt states symptoms UTI x4 days, burning with urination.  Took AZO without relief.  Also requests STD screening and pregnancy test.  Pt also states broken earbud in right ear.

## 2020-05-20 NOTE — Discharge Instructions (Addendum)
Recommend taking the doxycycline to cover possible chlamydia infection.  Recommend no sex until we know the results of your STD testing.  If this is positive, your partner should also receive treatment and you should wait to have sex until they have received appropriate treatment.  Recommend taking the cephalexin for urinary tract infection.  If you develop abdominal pain, fever, vomiting or other new concerning symptom, recommend return to ER for reassessment.  Recommend practicing safe sex which includes limiting partners, always using protection (i.e. condoms).

## 2020-05-20 NOTE — ED Provider Notes (Signed)
[facility] EMERGENCY DEPARTMENT Provider Note   CSN: [id number] Arrival date & time: 05/20/20  4656     History Chief Complaint  Patient presents with  . Urinary Tract Infection  . Foreign Body in Ear  . Exposure to STD    Patty Hughes is a 34 y.o. female.  Presenting to ER with concern for urinary tract infection.  States that she is been having dysuria for the past for 5 days.  Tried taking Azo without relief.  Reports currently sexually active with 1 female partner, does not use condoms, has NuvaRing.  No abdominal pain, vomiting, fever or diarrhea.  Patient also reports concern for foreign body in her right ear.  States that she thinks she cannot wear ear buds stuck in there and has been unable to get it out.  Occurred a few days ago.  HPI     Past Medical History:  Diagnosis Date  . Anxiety   . Bipolar 1 disorder (HCC)   . Borderline personality disorder (HCC)   . Fatty liver   . PTSD (post-traumatic stress disorder)   . Schizoaffective disorder [facility])     Patient Active Problem List   Diagnosis Date Noted  . Achilles tendonitis, bilateral 09/06/2015  . Equinus deformity of foot, acquired 09/06/2015  . Metatarsal deformity 09/06/2015    No past surgical history on file.   OB History   No obstetric history on file.     No family history on file.  Social History   Tobacco Use  . Smoking status: Current Every Day Smoker    Packs/day: 1.50    Types: Cigarettes  . Smokeless tobacco: Never Used  Substance Use Topics  . Alcohol use: Yes    Alcohol/week: 0.0 standard drinks    Comment: weekly  . Drug use: No    Home Medications Prior to Admission medications   Medication Sig Start Date End Date Taking? Authorizing Provider  DULoxetine (CYMBALTA) 30 MG capsule Take 30 mg by mouth daily.   Yes [provider]  FANAPT 6 MG TABS  08/03/15  Yes [provider]  albuterol (PROVENTIL HFA;VENTOLIN HFA) 108 (90 Base) MCG/ACT inhaler  Inhale 1-2 puffs into the lungs every 6 (six) hours as needed for wheezing or shortness of breath. 02/21/18   Rise Mu, PA-C  benzonatate (TESSALON) 100 MG capsule Take 1 capsule (100 mg total) by mouth every 8 (eight) hours. 02/21/18   Rise Mu, PA-C  cephALEXin (KEFLEX) 500 MG capsule Take 1 capsule (500 mg total) by mouth 4 (four) times daily. 05/20/20   Milagros Loll, MD  chlorpheniramine-HYDROcodone (TUSSIONEX PENNKINETIC ER) 10-8 MG/5ML SUER Take 5 mLs by mouth every 12 (twelve) hours as needed for cough. 12/02/18   Molpus, John, MD  clonazePAM (KLONOPIN) 1 MG tablet Take 1 mg by mouth 3 (three) times daily as needed for anxiety.    [provider]  doxycycline (VIBRAMYCIN) 100 MG capsule Take 1 capsule (100 mg total) by mouth 2 (two) times daily for 7 days. 05/20/20 05/27/20  Milagros Loll, MD  NUVARING 0.12-0.015 MG/24HR vaginal ring  08/30/15   [provider]  QUEtiapine (SEROQUEL) 50 MG tablet Take 50 mg by mouth at bedtime.    [provider]    Allergies    Haldol [haloperidol], Risperidone and related, and Lamictal [lamotrigine]  Review of Systems   Review of Systems  Constitutional: Negative for chills and fever.  HENT: Positive for ear pain. Negative for  sore throat.   Eyes: Negative for pain and visual disturbance.  Respiratory: Negative for cough and shortness of breath.   Cardiovascular: Negative for chest pain and palpitations.  Gastrointestinal: Negative for abdominal pain and vomiting.  Genitourinary: Positive for dysuria. Negative for hematuria.  Musculoskeletal: Negative for arthralgias and back pain.  Skin: Negative for color change and rash.  Neurological: Negative for seizures and syncope.  All other systems reviewed and are negative.   Physical Exam Updated Vital Signs BP 136/77   Pulse 88   Temp 98 F (36.7 C) (Oral)   Resp 16   Ht 5\' 4"  (1.626 m)   Wt 104.3 kg   SpO2 99%   BMI 39.48 kg/m    Physical Exam Vitals and nursing note reviewed. Exam conducted with a chaperone present.  Constitutional:      General: She is not in acute distress.    Appearance: She is well-developed.  HENT:     Head: Normocephalic and atraumatic.     Ears:     Comments: Blue foreign body in right ear canal; on inspection post removal, there is no erythema or drainage, TM is clear Eyes:     Conjunctiva/sclera: Conjunctivae normal.  Cardiovascular:     Rate and Rhythm: Normal rate and regular rhythm.     Heart sounds: No murmur.  Pulmonary:     Effort: Pulmonary effort is normal. No respiratory distress.     Breath sounds: Normal breath sounds.  Abdominal:     Palpations: Abdomen is soft.     Tenderness: There is no abdominal tenderness.  Genitourinary:    Comments: Margaret External genitalia appear normal, normal vagina, cervix, there is moderate creamy white foul-smelling discharge, no CMT, no adnexal tenderness or fullness bilaterally  Musculoskeletal:     Cervical back: Neck supple.  Skin:    General: Skin is warm and dry.  Neurological:     Mental Status: She is alert.     ED Results / Procedures / Treatments   Labs (all labs ordered are listed, but only abnormal results are displayed) Labs Reviewed  WET PREP, GENITAL - Abnormal; Notable for the following components:      Result Value   WBC, Wet Prep HPF POC MANY (*)    All other components within normal limits  URINALYSIS, ROUTINE W REFLEX MICROSCOPIC - Abnormal; Notable for the following components:   APPearance CLOUDY (*)    Specific Gravity, Urine >1.030 (*)    Bilirubin Urine SMALL (*)    Ketones, ur 15 (*)    Protein, ur 30 (*)    Nitrite POSITIVE (*)    Leukocytes,Ua SMALL (*)    All other components within normal limits  URINALYSIS, MICROSCOPIC (REFLEX) - Abnormal; Notable for the following components:   Bacteria, UA MANY (*)    All other components within normal limits  PREGNANCY, URINE  HIV  ANTIBODY (ROUTINE TESTING W REFLEX)  RPR  GC/CHLAMYDIA PROBE AMP ([place]) NOT AT [facility]    EKG None  Radiology No results found.  Procedures .Foreign Body Removal  Date/Time: 05/20/2020 10:36 AM Performed by: 05/22/2020, MD Authorized by: Milagros Loll, MD  Consent: Verbal consent obtained. Written consent not obtained. Risks and benefits: risks, benefits and alternatives were discussed Consent given by: patient Body area: ear Location details: right ear Localization method: visualized Removal mechanism: forceps Complexity: simple 1 objects recovered. Objects recovered: blue earbud tip Comments: Blue tip of earbud easily removed with forceps   (  including critical care time)  Medications Ordered in ED Medications  cefTRIAXone (ROCEPHIN) injection 500 mg (500 mg Intramuscular Given 05/20/20 0933)    ED Course  I have reviewed the triage vital signs and the nursing notes.  Pertinent labs & imaging results that were available during my care of the patient were reviewed by me and considered in my medical decision making (see chart for details).  Clinical Course as of May 20 1030  Sat May 20, 2020  0841 Successful foreign body removal of left ear   [RD]    Clinical Course User Index [RD] Lucrezia Starch, MD   MDM Rules/Calculators/A&P                      34 year old lady presenting to ER with multiple complaints.  Regarding her dysuria, UA consistent with UTI.  Will initiate cephalexin.  She is concerned about possible STD, did note significant discharge on exam.  Will treat empirically with Rocephin, doxycycline.  No CMT, no adnexal fullness or tenderness.  Regarding ear, easily removed foreign body.    After the discussed management above, the patient was determined to be safe for discharge.  The patient was in agreement with this plan and all questions regarding their care were answered.  ED return precautions were discussed and the patient will  return to the ED with any significant worsening of condition.    Final Clinical Impression(s) / ED Diagnoses Final diagnoses:  Cystitis  Encounter for assessment of STD exposure    Rx / DC Orders ED Discharge Orders         Ordered    doxycycline (VIBRAMYCIN) 100 MG capsule  2 times daily     05/20/20 0947    cephALEXin (KEFLEX) 500 MG capsule  4 times daily     05/20/20 0947           Lucrezia Starch, MD 05/20/20 1037

## 2020-05-21 LAB — HIV ANTIBODY (ROUTINE TESTING W REFLEX): HIV Screen 4th Generation wRfx: NONREACTIVE

## 2020-05-21 LAB — RPR: RPR Ser Ql: NONREACTIVE

## 2020-05-22 LAB — GC/CHLAMYDIA PROBE AMP (~~LOC~~) NOT AT ARMC
Chlamydia: NEGATIVE
Comment: NEGATIVE
Comment: NORMAL
Neisseria Gonorrhea: NEGATIVE

## 2020-06-26 ENCOUNTER — Emergency Department (HOSPITAL_BASED_OUTPATIENT_CLINIC_OR_DEPARTMENT_OTHER): Payer: Medicare Other

## 2020-06-26 ENCOUNTER — Encounter (HOSPITAL_BASED_OUTPATIENT_CLINIC_OR_DEPARTMENT_OTHER): Payer: Self-pay | Admitting: Emergency Medicine

## 2020-06-26 ENCOUNTER — Observation Stay (HOSPITAL_COMMUNITY): Payer: Medicare Other

## 2020-06-26 ENCOUNTER — Other Ambulatory Visit: Payer: Self-pay

## 2020-06-26 ENCOUNTER — Observation Stay (HOSPITAL_BASED_OUTPATIENT_CLINIC_OR_DEPARTMENT_OTHER)
Admission: EM | Admit: 2020-06-26 | Discharge: 2020-06-27 | Disposition: A | Discharge status: Home / Self Care | Payer: Medicare Other | Attending: Neurosurgery | Admitting: Neurosurgery

## 2020-06-26 DIAGNOSIS — S020XXA Fracture of vault of skull, initial encounter for closed fracture: Secondary | ICD-10-CM | POA: Diagnosis not present

## 2020-06-26 DIAGNOSIS — Z793 Long term (current) use of hormonal contraceptives: Secondary | ICD-10-CM | POA: Insufficient documentation

## 2020-06-26 DIAGNOSIS — F603 Borderline personality disorder: Secondary | ICD-10-CM | POA: Insufficient documentation

## 2020-06-26 DIAGNOSIS — S0003XA Contusion of scalp, initial encounter: Secondary | ICD-10-CM

## 2020-06-26 DIAGNOSIS — I609 Nontraumatic subarachnoid hemorrhage, unspecified: Secondary | ICD-10-CM | POA: Diagnosis present

## 2020-06-26 DIAGNOSIS — R2681 Unsteadiness on feet: Secondary | ICD-10-CM | POA: Insufficient documentation

## 2020-06-26 DIAGNOSIS — S066X0A Traumatic subarachnoid hemorrhage without loss of consciousness, initial encounter: Secondary | ICD-10-CM | POA: Diagnosis not present

## 2020-06-26 DIAGNOSIS — F259 Schizoaffective disorder, unspecified: Secondary | ICD-10-CM | POA: Insufficient documentation

## 2020-06-26 DIAGNOSIS — Z6837 Body mass index (BMI) 37.0-37.9, adult: Secondary | ICD-10-CM | POA: Diagnosis not present

## 2020-06-26 DIAGNOSIS — S065XAA Traumatic subdural hemorrhage with loss of consciousness status unknown, initial encounter: Secondary | ICD-10-CM | POA: Diagnosis present

## 2020-06-26 DIAGNOSIS — T148XXA Other injury of unspecified body region, initial encounter: Secondary | ICD-10-CM

## 2020-06-26 DIAGNOSIS — Z20822 Contact with and (suspected) exposure to covid-19: Secondary | ICD-10-CM | POA: Insufficient documentation

## 2020-06-26 DIAGNOSIS — S065X0A Traumatic subdural hemorrhage without loss of consciousness, initial encounter: Secondary | ICD-10-CM | POA: Diagnosis not present

## 2020-06-26 DIAGNOSIS — W19XXXA Unspecified fall, initial encounter: Secondary | ICD-10-CM | POA: Diagnosis not present

## 2020-06-26 DIAGNOSIS — F419 Anxiety disorder, unspecified: Secondary | ICD-10-CM | POA: Insufficient documentation

## 2020-06-26 DIAGNOSIS — S065X9A Traumatic subdural hemorrhage with loss of consciousness of unspecified duration, initial encounter: Secondary | ICD-10-CM | POA: Diagnosis present

## 2020-06-26 DIAGNOSIS — F17210 Nicotine dependence, cigarettes, uncomplicated: Secondary | ICD-10-CM | POA: Insufficient documentation

## 2020-06-26 DIAGNOSIS — K760 Fatty (change of) liver, not elsewhere classified: Secondary | ICD-10-CM | POA: Diagnosis not present

## 2020-06-26 DIAGNOSIS — Z79899 Other long term (current) drug therapy: Secondary | ICD-10-CM | POA: Diagnosis not present

## 2020-06-26 DIAGNOSIS — F319 Bipolar disorder, unspecified: Secondary | ICD-10-CM | POA: Diagnosis not present

## 2020-06-26 DIAGNOSIS — E669 Obesity, unspecified: Secondary | ICD-10-CM | POA: Insufficient documentation

## 2020-06-26 DIAGNOSIS — I6200 Nontraumatic subdural hemorrhage, unspecified: Secondary | ICD-10-CM

## 2020-06-26 LAB — CBC WITH DIFFERENTIAL/PLATELET
Abs Immature Granulocytes: 0.02 10*3/uL (ref 0.00–0.07)
Basophils Absolute: 0 10*3/uL (ref 0.0–0.1)
Basophils Relative: 1 %
Eosinophils Absolute: 0.1 10*3/uL (ref 0.0–0.5)
Eosinophils Relative: 1 %
HCT: 42.1 % (ref 36.0–46.0)
Hemoglobin: 14.2 g/dL (ref 12.0–15.0)
Immature Granulocytes: 0 %
Lymphocytes Relative: 23 %
Lymphs Abs: 1.7 10*3/uL (ref 0.7–4.0)
MCH: 32.3 pg (ref 26.0–34.0)
MCHC: 33.7 g/dL (ref 30.0–36.0)
MCV: 95.9 fL (ref 80.0–100.0)
Monocytes Absolute: 0.6 10*3/uL (ref 0.1–1.0)
Monocytes Relative: 7 %
Neutro Abs: 5.2 10*3/uL (ref 1.7–7.7)
Neutrophils Relative %: 68 %
Platelets: 176 10*3/uL (ref 150–400)
RBC: 4.39 MIL/uL (ref 3.87–5.11)
RDW: 13.2 % (ref 11.5–15.5)
WBC: 7.6 10*3/uL (ref 4.0–10.5)
nRBC: 0 % (ref 0.0–0.2)

## 2020-06-26 LAB — BASIC METABOLIC PANEL
Anion gap: 11 (ref 5–15)
BUN: <5 mg/dL — ABNORMAL LOW (ref 6–20)
CO2: 24 mmol/L (ref 22–32)
Calcium: 9 mg/dL (ref 8.9–10.3)
Chloride: 105 mmol/L (ref 98–111)
Creatinine, Ser: 0.76 mg/dL (ref 0.44–1.00)
GFR calc Af Amer: >60 mL/min (ref >60)
GFR calc non Af Amer: >60 mL/min (ref >60)
Glucose, Bld: 133 mg/dL — ABNORMAL HIGH (ref 70–99)
Potassium: 3.9 mmol/L (ref 3.5–5.1)
Sodium: 140 mmol/L (ref 135–145)

## 2020-06-26 LAB — SARS CORONAVIRUS 2 BY RT PCR (HOSPITAL ORDER, PERFORMED IN ~~LOC~~ HOSPITAL LAB): SARS Coronavirus 2: NEGATIVE

## 2020-06-26 MED ORDER — MORPHINE SULFATE (PF) 4 MG/ML IV SOLN
4.0000 mg | Freq: Once | INTRAVENOUS | Status: AC
  Administered 2020-06-26: 4 mg via INTRAVENOUS
  Filled 2020-06-26: qty 1

## 2020-06-26 MED ORDER — TETANUS-DIPHTH-ACELL PERTUSSIS 5-2.5-18.5 LF-MCG/0.5 IM SUSP
0.5000 mL | Freq: Once | INTRAMUSCULAR | Status: AC
  Administered 2020-06-26: 0.5 mL via INTRAMUSCULAR
  Filled 2020-06-26: qty 0.5

## 2020-06-26 MED ORDER — LORAZEPAM 1 MG PO TABS: 0.0000 mg | ORAL_TABLET | Freq: Two times a day (BID) | ORAL | Status: DC

## 2020-06-26 MED ORDER — POLYETHYLENE GLYCOL 3350 17 G PO PACK: 17.0000 g | PACK | Freq: Every day | ORAL | Status: DC | PRN

## 2020-06-26 MED ORDER — LORAZEPAM 2 MG/ML IJ SOLN
0.0000 mg | Freq: Four times a day (QID) | INTRAMUSCULAR | Status: DC
  Administered 2020-06-26: 2 mg via INTRAVENOUS
  Filled 2020-06-26: qty 1

## 2020-06-26 MED ORDER — LORAZEPAM 2 MG/ML IJ SOLN: 0.0000 mg | Freq: Two times a day (BID) | INTRAMUSCULAR | Status: DC

## 2020-06-26 MED ORDER — BISACODYL 5 MG PO TBEC: 5.0000 mg | DELAYED_RELEASE_TABLET | Freq: Every day | ORAL | Status: DC | PRN

## 2020-06-26 MED ORDER — QUETIAPINE FUMARATE 50 MG PO TABS
50.0000 mg | ORAL_TABLET | Freq: Every day | ORAL | Status: DC
  Administered 2020-06-26: 50 mg via ORAL
  Filled 2020-06-26: qty 1

## 2020-06-26 MED ORDER — SODIUM CHLORIDE 0.9 % IV BOLUS
1000.0000 mL | Freq: Once | INTRAVENOUS | Status: AC
  Administered 2020-06-26: 1000 mL via INTRAVENOUS

## 2020-06-26 MED ORDER — THIAMINE HCL 100 MG/ML IJ SOLN
100.0000 mg | Freq: Every day | INTRAMUSCULAR | Status: DC
  Administered 2020-06-26: 100 mg via INTRAVENOUS
  Filled 2020-06-26: qty 2

## 2020-06-26 MED ORDER — ILOPERIDONE 2 MG PO TABS
12.0000 mg | ORAL_TABLET | Freq: Every day | ORAL | Status: DC
  Administered 2020-06-26: 12 mg via ORAL
  Filled 2020-06-26 (×2): qty 6

## 2020-06-26 MED ORDER — THIAMINE HCL 100 MG/ML IJ SOLN
100.0000 mg | Freq: Once | INTRAMUSCULAR | Status: AC
  Administered 2020-06-26: 100 mg via INTRAVENOUS
  Filled 2020-06-26: qty 2

## 2020-06-26 MED ORDER — ONDANSETRON HCL 4 MG PO TABS: 4.0000 mg | ORAL_TABLET | Freq: Four times a day (QID) | ORAL | Status: DC | PRN

## 2020-06-26 MED ORDER — NICOTINE 21 MG/24HR TD PT24
MEDICATED_PATCH | TRANSDERMAL | Status: AC
  Administered 2020-06-26: 21 mg via TRANSDERMAL
  Filled 2020-06-26: qty 1

## 2020-06-26 MED ORDER — DIPHENHYDRAMINE HCL 50 MG/ML IJ SOLN
12.5000 mg | Freq: Once | INTRAMUSCULAR | Status: AC
  Administered 2020-06-26: 12.5 mg via INTRAVENOUS
  Filled 2020-06-26: qty 1

## 2020-06-26 MED ORDER — HYDROCODONE-ACETAMINOPHEN 5-325 MG PO TABS
1.0000 | ORAL_TABLET | ORAL | Status: DC | PRN
  Administered 2020-06-26: 2 via ORAL
  Filled 2020-06-26: qty 2

## 2020-06-26 MED ORDER — HYDROMORPHONE HCL 1 MG/ML IJ SOLN: 0.5000 mg | INTRAMUSCULAR | Status: DC | PRN

## 2020-06-26 MED ORDER — SODIUM CHLORIDE 0.9 % IV SOLN: INTRAVENOUS | Status: DC

## 2020-06-26 MED ORDER — METHOCARBAMOL 1000 MG/10ML IJ SOLN
500.0000 mg | Freq: Four times a day (QID) | INTRAVENOUS | Status: DC | PRN
  Filled 2020-06-26: qty 5

## 2020-06-26 MED ORDER — ACETAMINOPHEN 325 MG PO TABS
650.0000 mg | ORAL_TABLET | Freq: Four times a day (QID) | ORAL | Status: DC | PRN
  Administered 2020-06-27: 650 mg via ORAL
  Filled 2020-06-26: qty 2

## 2020-06-26 MED ORDER — DOCUSATE SODIUM 100 MG PO CAPS
100.0000 mg | ORAL_CAPSULE | Freq: Two times a day (BID) | ORAL | Status: DC
  Administered 2020-06-27: 100 mg via ORAL
  Filled 2020-06-26: qty 1

## 2020-06-26 MED ORDER — METOCLOPRAMIDE HCL 5 MG/ML IJ SOLN
10.0000 mg | Freq: Once | INTRAMUSCULAR | Status: AC
  Administered 2020-06-26: 10 mg via INTRAVENOUS
  Filled 2020-06-26: qty 2

## 2020-06-26 MED ORDER — CLONAZEPAM 1 MG PO TABS
1.0000 mg | ORAL_TABLET | Freq: Three times a day (TID) | ORAL | Status: DC | PRN
  Administered 2020-06-26 – 2020-06-27 (×2): 1 mg via ORAL
  Filled 2020-06-26 (×2): qty 1

## 2020-06-26 MED ORDER — DULOXETINE HCL 30 MG PO CPEP
30.0000 mg | ORAL_CAPSULE | Freq: Every day | ORAL | Status: DC
  Administered 2020-06-27: 30 mg via ORAL
  Filled 2020-06-26: qty 1

## 2020-06-26 MED ORDER — HYDROMORPHONE HCL 1 MG/ML IJ SOLN
0.5000 mg | Freq: Once | INTRAMUSCULAR | Status: AC
  Administered 2020-06-26: 0.5 mg via INTRAVENOUS
  Filled 2020-06-26: qty 1

## 2020-06-26 MED ORDER — METOPROLOL TARTRATE 5 MG/5ML IV SOLN: 5.0000 mg | Freq: Four times a day (QID) | INTRAVENOUS | Status: DC | PRN

## 2020-06-26 MED ORDER — IOHEXOL 300 MG/ML  SOLN
100.0000 mL | Freq: Once | INTRAMUSCULAR | Status: AC | PRN
  Administered 2020-06-26: 100 mL via INTRAVENOUS

## 2020-06-26 MED ORDER — ACETAMINOPHEN 650 MG RE SUPP: 650.0000 mg | Freq: Four times a day (QID) | RECTAL | Status: DC | PRN

## 2020-06-26 MED ORDER — ONDANSETRON HCL 4 MG/2ML IJ SOLN: 4.0000 mg | Freq: Four times a day (QID) | INTRAMUSCULAR | Status: DC | PRN

## 2020-06-26 MED ORDER — FOLIC ACID 1 MG PO TABS
1.0000 mg | ORAL_TABLET | Freq: Once | ORAL | Status: AC
  Administered 2020-06-26: 1 mg via ORAL
  Filled 2020-06-26: qty 1

## 2020-06-26 MED ORDER — THIAMINE HCL 100 MG PO TABS: 100.0000 mg | ORAL_TABLET | Freq: Every day | ORAL | Status: DC

## 2020-06-26 MED ORDER — SENNA 8.6 MG PO TABS
1.0000 | ORAL_TABLET | Freq: Two times a day (BID) | ORAL | Status: DC
  Administered 2020-06-27: 8.6 mg via ORAL
  Filled 2020-06-26: qty 1

## 2020-06-26 MED ORDER — LORAZEPAM 1 MG PO TABS: 0.0000 mg | ORAL_TABLET | Freq: Four times a day (QID) | ORAL | Status: DC

## 2020-06-26 MED ORDER — PANTOPRAZOLE SODIUM 40 MG PO TBEC
40.0000 mg | DELAYED_RELEASE_TABLET | Freq: Every day | ORAL | Status: DC
  Administered 2020-06-27: 40 mg via ORAL
  Filled 2020-06-26: qty 1

## 2020-06-26 MED ORDER — NICOTINE 21 MG/24HR TD PT24
21.0000 mg | MEDICATED_PATCH | Freq: Once | TRANSDERMAL | Status: AC
  Filled 2020-06-26 (×2): qty 1

## 2020-06-26 NOTE — ED Provider Notes (Signed)
Patient with subarachnoid hemorrhage, subdural hemorrhage and fracture of the right occipital calvarium presents transferred from [facility].  Dr. Wynetta Emery with neurosurgery agreeable to admission.  Patient transferred ED to ED to expedite neurosurgical evaluation due to no ICU bed availability.   On my assessment patient is complaining of a generalized headache, photophobia.  She states she feels as though it is a little difficult to talk.  She endorses drinking beer and wine daily, "a lot, probably too much".  Endorses drinking a couple of bottles of wine, both large and small daily.  Yesterday had several bottles of wine and split a sixpack of beer with her significant other.  Expressed concern regarding alcohol withdrawals. Physical Exam  BP 128/85   Pulse 86   Temp 98 F (36.7 C) (Oral)   Resp (!) 36   Ht 5\' 5"  (1.651 m)   Wt 102.5 kg   LMP 05/22/2020   SpO2 95%   BMI 37.61 kg/m   Physical Exam Vitals and nursing note reviewed.  Constitutional:      General: She is not in acute distress.    Appearance: She is well-developed.  HENT:     Head: Normocephalic.  Eyes:     General:        Right eye: No discharge.        Left eye: No discharge.     Extraocular Movements: Extraocular movements intact.     Conjunctiva/sclera: Conjunctivae normal.     Pupils: Pupils are equal, round, and reactive to light.     Comments: Pupils pinpoint, minimally reactive to light, equal bilaterally  Neck:     Vascular: No JVD.     Trachea: No tracheal deviation.  Cardiovascular:     Rate and Rhythm: Normal rate.  Pulmonary:     Effort: Pulmonary effort is normal.  Abdominal:     General: There is no distension.  Skin:    Findings: No erythema.  Neurological:     Mental Status: She is alert and oriented to person, place, and time.     Cranial Nerves: No cranial nerve deficit.     Motor: No weakness.     Comments: Answers questions appropriately, follows commands without difficulty.   Cranial nerves II through XII tested and intact.  Moves all extremities spontaneously without difficulty.  Psychiatric:        Behavior: Behavior normal.     ED Course/Procedures   Procedures  MDM  CIWA protocol initiated.  She does not appear to be withdrawing at this time.  CONSULT: Spoke with 05/24/2020, PA with neurosurgical service.  She is aware the patient is in the department.  Confirmed patient is okay to eat and drink at this time.      Leo Grosser, PA-C 06/26/20 1739    06/28/20, MD 06/27/20 [phone]

## 2020-06-26 NOTE — ED Notes (Signed)
Pt called out for nursing help. Pt stated she does not like dilaudid because it just makes her sleep. She would prefer to receive Morphine.

## 2020-06-26 NOTE — ED Triage Notes (Signed)
Pt states she has been drinking last night.   Pt believes she had a black out, woke up outside on the concrete.  Pt states she made it in the house and fell asleep, woke up the am vomited, then called 911.

## 2020-06-26 NOTE — ED Notes (Signed)
Patient's cousin, Marin Comment, stated that patient is an alcoholic and during their conversation, patient is ready for rehab.

## 2020-06-26 NOTE — ED Notes (Signed)
Posterior head is slightly swollen and tender to touch.

## 2020-06-26 NOTE — ED Provider Notes (Addendum)
[facility] EMERGENCY DEPARTMENT Provider Note   CSN: [id number] Arrival date & time: 06/26/20  1032     History Chief Complaint  Patient presents with   Headache    Patty PraderJessica N Hughes is a 34 y.o. female history of bipolar, anxiety, schizoaffective disorder, PTSD, obesity.  Patient presents today for headache and head injury.  She reports that she was by herself last night drinking wine and beer, she thinks that she blacked out.  She does not recall going outside but she woke up on the concrete outside face up.  She reports that she had a posterior headache at that time, she is able to go back inside to fall asleep.  When she woke up this morning she describes a headache as a posterior aching sensation constant nonradiating moderate intensity no aggravating or alleviating factors.  She had one episode of nonbloody/nonbilious emesis and called 911 to take her to the ER for evaluation.  Denies vision changes, neck pain, chest pain, back pain, abdominal pain, extremity pain or any additional concerns.  HPI     Past Medical History:  Diagnosis Date   Anxiety    Bipolar 1 disorder (HCC)    Borderline personality disorder (HCC)    Fatty liver    PTSD (post-traumatic stress disorder)    Schizoaffective disorder [facility](HCC)     Patient Active Problem List   Diagnosis Date Noted   Subarachnoid bleed (HCC) 06/26/2020   Achilles tendonitis, bilateral 09/06/2015   Equinus deformity of foot, acquired 09/06/2015   Metatarsal deformity 09/06/2015    No past surgical history on file.   OB History   No obstetric history on file.     No family history on file.  Social History   Tobacco Use   Smoking status: Current Every Day Smoker    Packs/day: 1.50    Types: Cigarettes   Smokeless tobacco: Never Used  Vaping Use   Vaping Use: Never used  Substance Use Topics   Alcohol use: Yes    Comment: weekly   Drug use: No    Home Medications Prior to Admission  medications   Medication Sig Start Date End Date Taking? Authorizing Provider  clonazePAM (KLONOPIN) 1 MG tablet Take 1 mg by mouth 3 (three) times daily as needed for anxiety.   Yes [provider]  DULoxetine (CYMBALTA) 30 MG capsule Take 30 mg by mouth daily.   Yes [provider]  FANAPT 6 MG TABS  08/03/15   [provider]  NUVARING 0.12-0.015 MG/24HR vaginal ring  08/30/15   [provider]  QUEtiapine (SEROQUEL) 50 MG tablet Take 50 mg by mouth at bedtime.    [provider]    Allergies    Aripiprazole, Haldol [haloperidol], Lurasidone, Risperidone and related, and Lamictal [lamotrigine]  Review of Systems   Review of Systems Ten systems are reviewed and are negative for acute change except as noted in the HPI  Physical Exam Updated Vital Signs BP 133/89 (BP Location: Right Arm)    Pulse 100    Temp 97.6 F (36.4 C) (Oral)    Resp 18    Ht 5\' 5"  (1.651 m)    Wt 102.5 kg    LMP 05/22/2020    SpO2 93%    BMI 37.61 kg/m   Physical Exam Constitutional:      General: She is not in acute distress.    Appearance: Normal appearance. She is well-developed. She is not ill-appearing or diaphoretic.  HENT:  Head: Normocephalic. Abrasion and contusion present.     Jaw: There is normal jaw occlusion. No trismus.      Comments: Hematoma right occipital scalp with pinpoint abrasion.    Right Ear: Tympanic membrane and external ear normal. No hemotympanum.     Left Ear: Tympanic membrane and external ear normal. No hemotympanum.     Nose:     Right Nostril: No epistaxis.     Left Nostril: No epistaxis.     Mouth/Throat:     Mouth: Mucous membranes are moist.     Pharynx: Oropharynx is clear.  Eyes:     General: Vision grossly intact. Gaze aligned appropriately.     Extraocular Movements: Extraocular movements intact.     Conjunctiva/sclera: Conjunctivae normal.     Pupils: Pupils are equal, round, and reactive to light.  Neck:      Trachea: Trachea and phonation normal. No tracheal tenderness or tracheal deviation.  Cardiovascular:     Rate and Rhythm: Normal rate and regular rhythm.     Pulses:          Dorsalis pedis pulses are 2+ on the right side and 2+ on the left side.     Heart sounds: Normal heart sounds.  Pulmonary:     Effort: Pulmonary effort is normal. No respiratory distress.     Breath sounds: Normal breath sounds and air entry.  Chest:     Chest wall: No deformity or tenderness.  Abdominal:     General: There is no distension.     Palpations: Abdomen is soft.     Tenderness: There is no abdominal tenderness. There is no guarding or rebound.  Musculoskeletal:        General: Normal range of motion.     Cervical back: Normal range of motion and neck supple. No spinous process tenderness or muscular tenderness.     Comments: No midline C/T/L spinal tenderness to palpation, no paraspinal muscle tenderness, no deformity, crepitus, or step-off noted. No sign of injury to the neck or back.  Feet:     Right foot:     Protective Sensation: 5 sites tested. 5 sites sensed.     Left foot:     Protective Sensation: 5 sites tested. 5 sites sensed.  Skin:    General: Skin is warm and dry.  Neurological:     Mental Status: She is alert.     GCS: GCS eye subscore is 4. GCS verbal subscore is 5. GCS motor subscore is 6.     Comments: Speech is clear and goal oriented, follows commands Major Cranial nerves without deficit, no facial droop Normal strength in upper and lower extremities bilaterally including dorsiflexion and plantar flexion, strong and equal grip strength Sensation normal to light and sharp touch Moves extremities without ataxia, coordination intact Normal finger to nose and rapid alternating movements Neg romberg, no pronator drift Normal heel-shin  Psychiatric:        Behavior: Behavior normal.    ED Results / Procedures / Treatments   Labs (all labs ordered are listed, but only abnormal  results are displayed) Labs Reviewed  BASIC METABOLIC PANEL - Abnormal; Notable for the following components:      Result Value   Glucose, Bld 133 (*)    BUN <5 (*)    All other components within normal limits  SARS CORONAVIRUS 2 BY RT PCR (HOSPITAL ORDER, PERFORMED IN [facility])  CBC WITH DIFFERENTIAL/PLATELET  PREGNANCY, URINE  EKG None  Radiology No results found.  Procedures .Critical Care Performed by: Bill Salinas, PA-C Authorized by: Bill Salinas, PA-C   Critical care provider statement:    Critical care time (minutes):  31   Critical care was necessary to treat or prevent imminent or life-threatening deterioration of the following conditions:  Trauma   Critical care was time spent personally by me on the following activities:  Discussions with consultants, evaluation of patient's response to treatment, examination of patient, ordering and performing treatments and interventions, ordering and review of laboratory studies, ordering and review of radiographic studies, pulse oximetry, re-evaluation of patient's condition, obtaining history from patient or surrogate, review of old charts and development of treatment plan with patient or surrogate   (including critical care time)  Medications Ordered in ED Medications  Tdap (BOOSTRIX) injection 0.5 mL (has no administration in time range)  sodium chloride 0.9 % bolus 1,000 mL (1,000 mLs Intravenous New Bag/Given 06/26/20 1114)  diphenhydrAMINE (BENADRYL) injection 12.5 mg (12.5 mg Intravenous Given 06/26/20 1123)  metoCLOPramide (REGLAN) injection 10 mg (10 mg Intravenous Given 06/26/20 1123)  folic acid (FOLVITE) tablet 1 mg (1 mg Oral Given 06/26/20 1121)  thiamine (B-1) injection 100 mg (100 mg Intravenous Given 06/26/20 1121)  iohexol (OMNIPAQUE) 300 MG/ML solution 100 mL (100 mLs Intravenous Contrast Given 06/26/20 1145)  morphine 4 MG/ML injection 4 mg (4 mg Intravenous Given 06/26/20 1219)     ED Course  I have reviewed the triage vital signs and the nursing notes.  Pertinent labs & imaging results that were available during my care of the patient were reviewed by me and considered in my medical decision making (see chart for details).  Clinical Course as of Jun 27 1239  Mon Jun 26, 2020  1152 Dr. Wynetta Emery   [BM]    Clinical Course User Index [BM] Elizabeth Palau   MDM Rules/Calculators/A&P                         Additional History Obtained: 1. Nursing notes from this visit. - 34 year old female history as detailed above presents today with headache and hematoma to the right occipital scalp.  She was drinking alcohol last night does not recall what happened.  She woke up on the cement outside believes that she hit her head.  She went back inside last night and back to sleep.  When she woke up this morning she vomited and called EMS.  On exam she is anxious appearing, she has a hematoma to the right occipital scalp with a very small abrasion.  No hemotympanum, extraocular motion is intact, no tenderness of the face, no acute dental injury, cranial nerves intact, no pain at the neck, back, chest, abdomen, pelvis or extremities.  Plan of care is to obtain CT head/cervical spine, give migraine cocktail obtain basic blood work/pregnancy test and update Tdap. - CT head/cervical spine:  1146 IMPRESSION: CT head:  1. Small volume acute subarachnoid hemorrhage overlying the inferolateral left temporal lobe. Subtle underlying left temporal lobe parenchymal contusion cannot be excluded. 2. Trace acute subdural hemorrhage layering along left tentorium. 3. Nondepressed fracture of the right occipital calvarium. Consider CT venography to assess for injury to the right transverse dural venous sinus. 4. Overlying right parietooccipital scalp hematoma. 5. Minimal ethmoid sinus mucosal thickening.  CT cervical spine:  No evidence of acute fracture to the cervical spine.    [BM]  - I spoke with Dr. Wynetta Emery  at 11:52 AM.  He advised that patient be transferred to [facility] and admitted to ICU on [address].  Advises readmission to neurosurgery service.  I then placed temporary admission orders and ordered the screening Covid test.  I reviewed and interpreted the following labs: CBC within normal limits, no leukocytosis to suggest infection no evidence of anemia. BMP shows no emergent electrolyte derangement, evidence of acute kidney injury or gap. - Patient reevaluated resting comfortably no acute distress her boyfriend is at bedside.  Additional 4 mg morphine was given for pain control.  Patient reports improvement of symptoms she has no concerns or complaints at this time she states understanding of care plan and is agreeable for transfer.  CT Venogram Head: Impression: No evidence of dural venous sinus injury. - Case discussed with Dr. Judd Lien during this visit.   --------------------- 3:20 PM Addendum: Informed that there will be several hour wait prior to ICU bed availability.  Patient will be transferred ED to ED to [facility].  Discussed with Dr. Lockie Mola who is accepting.  Nursing secretary arranging transfer.  3:28 PM: Patient resting comfortably talking on the phone no acute distress.  Note: Portions of this report may have been transcribed using voice recognition software. Every effort was made to ensure accuracy; however, inadvertent computerized transcription errors may still be present. Final Clinical Impression(s) / ED Diagnoses Final diagnoses:  Subarachnoid hemorrhage (HCC)  Subdural hemorrhage (HCC)  Closed fracture of vault of skull, initial encounter (HCC)  Hematoma of scalp, initial encounter  Abrasion    Rx / DC Orders ED Discharge Orders    None        Elizabeth Palau 06/26/20 1529    Geoffery Lyons, MD 06/27/20 [phone]

## 2020-06-26 NOTE — H&P (Addendum)
Patty Hughes is an 34 y.o. female.   HPI:  34 year old female presented to the ED today after sustaining a fall last night. She states that she was heavily intoxicated and felt very nauseous so she went outside to vomit. She said that she passed out and woke up on the concrete. Since then she has had severe headaches with photophobia. Denies any NV or vision changes. States that she is dizzy at times. She states that she drinks every day and does not know that amount but she "drinks a lot on a daily basis." she consumes beer and wine usually.   Past Medical History:  Diagnosis Date  . Anxiety   . Bipolar 1 disorder (HCC)   . Borderline personality disorder (HCC)   . Fatty liver   . PTSD (post-traumatic stress disorder)   . Schizoaffective disorder (HCC)     History reviewed. No pertinent surgical history.  Allergies  Allergen Reactions  . Aripiprazole Other (See Comments)    restless  . Haldol [Haloperidol]     "Made me feel like a zombie"  . Lurasidone Other (See Comments)    restless  . Risperidone And Related Other (See Comments)    restless  . Lamictal [Lamotrigine] Rash    Social History   Tobacco Use  . Smoking status: Current Every Day Smoker    Packs/day: 1.50    Types: Cigarettes  . Smokeless tobacco: Never Used  Substance Use Topics  . Alcohol use: Yes    Comment: weekly    History reviewed. No pertinent family history.   Review of Systems  Positive ROS: as above  All other systems have been reviewed and were otherwise negative with the exception of those mentioned in the HPI and as above.  Objective: Vital signs in last 24 hours: Temp:  [97.6 F (36.4 C)-98 F (36.7 C)] 98 F (36.7 C) (06/28 1702) Pulse Rate:  [71-100] 84 (06/28 1800) Resp:  [10-36] 14 (06/28 1800) BP: (110-133)/(68-89) 125/81 (06/28 1800) SpO2:  [93 %-100 %] 95 % (06/28 1800) Weight:  [102.5 kg] 102.5 kg (06/28 1032)  General Appearance: Alert, cooperative, no distress,  appears stated age Head: Normocephalic, without obvious abnormality, atraumatic Eyes: PERRL, conjunctiva/corneas clear, EOM's intact, fundi benign, both eyes      Lungs:respirations unlabored Heart: Regular rate and rhythm  NEUROLOGIC:   Mental status: A&O x4, no aphasia, good attention span, Memory and fund of knowledge Motor Exam - grossly normal, normal tone and bulk Sensory Exam - grossly normal Reflexes: symmetric, no pathologic reflexes, No Hoffman's, No clonus Coordination - not tested Gait - not tested Balance - not tested Cranial Nerves: I: smell Not tested  II: visual acuity  OS: na    OD: na  II: visual fields Full to confrontation  II: pupils Equal, round, reactive to light  III,VII: ptosis None  III,IV,VI: extraocular muscles  Full ROM  V: mastication Normal  V: facial light touch sensation  Normal  V,VII: corneal reflex  Present  VII: facial muscle function - upper  Normal  VII: facial muscle function - lower Normal  VIII: hearing Not tested  IX: soft palate elevation  Normal  IX,X: gag reflex Present  XI: trapezius strength  5/5  XI: sternocleidomastoid strength 5/5  XI: neck flexion strength  5/5  XII: tongue strength  Normal    Data Review Lab Results  Component Value Date   WBC 7.6 06/26/2020   HGB 14.2 06/26/2020   HCT 42.1  06/26/2020   MCV 95.9 06/26/2020   PLT 176 06/26/2020   Lab Results  Component Value Date   NA 140 06/26/2020   K 3.9 06/26/2020   CL 105 06/26/2020   CO2 24 06/26/2020   BUN <5 (L) 06/26/2020   CREATININE 0.76 06/26/2020   GLUCOSE 133 (H) 06/26/2020   No results found for: INR, PROTIME  Radiology: CT Head Wo Contrast  Result Date: 06/26/2020 CLINICAL DATA:  Headache, posttraumatic. EXAM: CT HEAD WITHOUT CONTRAST CT CERVICAL SPINE WITHOUT CONTRAST TECHNIQUE: Multidetector CT imaging of the head and cervical spine was performed following the standard protocol without intravenous contrast. Multiplanar CT image  reconstructions of the cervical spine were also generated. COMPARISON:  Report from head CT 06/25/2013 (images unavailable). FINDINGS: CT HEAD FINDINGS Brain: There is small volume acute subarachnoid hemorrhage along the inferolateral left temporal lobe. Subtle underlying left temporal lobe parenchymal contusion cannot be excluded. Additionally, there is trace acute subdural hemorrhage along the left tentorium (for instance as seen on series 4, image 42). No demarcated cortical infarct. No evidence of intracranial mass. No hydrocephalus or midline shift. Vascular: No hyperdense vessel. Skull: There is an acute, nondepressed fracture of the right occipital calvarium (for instance as seen on series 3, image 26). Sinuses/Orbits: Visualized orbits show no acute finding. Minimal ethmoid sinus mucosal thickening. No significant mastoid effusion. Other: Right parietooccipital scalp hematoma. CT CERVICAL SPINE FINDINGS Alignment: Straightening of the expected cervical lordosis. No significant spondylolisthesis. Cervical levocurvature, which may be positional. Skull base and vertebrae: The basion-dental and atlanto-dental intervals are maintained.No evidence of acute fracture to the cervical spine. Soft tissues and spinal canal: No prevertebral fluid or swelling. No visible canal hematoma. Disc levels: No significant bony spinal canal or neural foraminal narrowing at any level. Upper chest: No consolidation within the imaged lung apices. No visible pneumothorax. These results were called by telephone at the time of interpretation on 06/26/2020 at 11:31 am to provider [facility] , who verbally acknowledged these results. IMPRESSION: CT head: 1. Small volume acute subarachnoid hemorrhage overlying the inferolateral left temporal lobe. Subtle underlying left temporal lobe parenchymal contusion cannot be excluded. 2. Trace acute subdural hemorrhage layering along left tentorium. 3. Nondepressed fracture of the right  occipital calvarium. Consider CT venography to assess for injury to the right transverse dural venous sinus. 4. Overlying right parietooccipital scalp hematoma. 5. Minimal ethmoid sinus mucosal thickening. CT cervical spine: No evidence of acute fracture to the cervical spine. Electronically Signed   By: Jackey Loge DO   On: 06/26/2020 11:31   CT Cervical Spine Wo Contrast  Result Date: 06/26/2020 CLINICAL DATA:  Headache, posttraumatic. EXAM: CT HEAD WITHOUT CONTRAST CT CERVICAL SPINE WITHOUT CONTRAST TECHNIQUE: Multidetector CT imaging of the head and cervical spine was performed following the standard protocol without intravenous contrast. Multiplanar CT image reconstructions of the cervical spine were also generated. COMPARISON:  Report from head CT 06/25/2013 (images unavailable). FINDINGS: CT HEAD FINDINGS Brain: There is small volume acute subarachnoid hemorrhage along the inferolateral left temporal lobe. Subtle underlying left temporal lobe parenchymal contusion cannot be excluded. Additionally, there is trace acute subdural hemorrhage along the left tentorium (for instance as seen on series 4, image 42). No demarcated cortical infarct. No evidence of intracranial mass. No hydrocephalus or midline shift. Vascular: No hyperdense vessel. Skull: There is an acute, nondepressed fracture of the right occipital calvarium (for instance as seen on series 3, image 26). Sinuses/Orbits: Visualized orbits show no acute finding. Minimal ethmoid sinus mucosal  thickening. No significant mastoid effusion. Other: Right parietooccipital scalp hematoma. CT CERVICAL SPINE FINDINGS Alignment: Straightening of the expected cervical lordosis. No significant spondylolisthesis. Cervical levocurvature, which may be positional. Skull base and vertebrae: The basion-dental and atlanto-dental intervals are maintained.No evidence of acute fracture to the cervical spine. Soft tissues and spinal canal: No prevertebral fluid or  swelling. No visible canal hematoma. Disc levels: No significant bony spinal canal or neural foraminal narrowing at any level. Upper chest: No consolidation within the imaged lung apices. No visible pneumothorax. These results were called by telephone at the time of interpretation on 06/26/2020 at 11:31 am to provider [facility] , who verbally acknowledged these results. IMPRESSION: CT head: 1. Small volume acute subarachnoid hemorrhage overlying the inferolateral left temporal lobe. Subtle underlying left temporal lobe parenchymal contusion cannot be excluded. 2. Trace acute subdural hemorrhage layering along left tentorium. 3. Nondepressed fracture of the right occipital calvarium. Consider CT venography to assess for injury to the right transverse dural venous sinus. 4. Overlying right parietooccipital scalp hematoma. 5. Minimal ethmoid sinus mucosal thickening. CT cervical spine: No evidence of acute fracture to the cervical spine. Electronically Signed   By: Jackey Loge DO   On: 06/26/2020 11:31   CT VENOGRAM HEAD  Result Date: 06/26/2020 CLINICAL DATA:  Right occipital fracture EXAM: CT VENOGRAM HEAD TECHNIQUE: Contiguous axial images were obtained from the vertex to skull base after the administration of intravenous contrast. Coronal and sagittal reformats were obtained. Maximum intensity projection images were created for better evaluation the veins. CONTRAST:  OMNIPAQUE IOHEXOL 300 MG/ML  SOLN COMPARISON:  None. FINDINGS: Superior sagittal sinus, straight sinus, vein of Galen, and internal cerebral veins are patent. Transverse and sigmoid sinuses are patent. The right transverse sinus is dominant. Right occipital scalp soft tissue swelling. Nondisplaced right occipital calvarium fracture. IMPRESSION: No evidence of dural venous sinus injury. Electronically Signed   By: Guadlupe Spanish M.D.   On: 06/26/2020 12:08     Assessment/Plan: 34 year old female presented to the ED after drinking  and falling last night. CT head revealed a small acute subarachnoid hemorrhage over the left temporal lobe and trace SDH along the left tentorium with a nondepressed fracture of the right occipital calvarium.. She denies any use of blood thinners. He venogram was negative. We will admit her to stepdown at this time for observation. No surgical intervention is warranted. Repeat head CT in 2 hours.    Tiana Loft [facility] 06/26/2020 6:23 PM  Agree with above

## 2020-06-26 NOTE — ED Notes (Signed)
Pt transported to floor by this RN in stable condition.

## 2020-06-26 NOTE — ED Notes (Signed)
Unit secretary contacted bed placement and no beds available at this time. EDP notified. Attempting to arrange ED to ED transfer to not delay neurology follow up.

## 2020-06-27 DIAGNOSIS — S066X0A Traumatic subarachnoid hemorrhage without loss of consciousness, initial encounter: Secondary | ICD-10-CM | POA: Diagnosis not present

## 2020-06-27 MED ORDER — MORPHINE SULFATE (PF) 2 MG/ML IV SOLN: 1.0000 mg | INTRAVENOUS | Status: DC | PRN

## 2020-06-27 MED ORDER — METHOCARBAMOL 500 MG PO TABS: 500.0000 mg | ORAL_TABLET | Freq: Three times a day (TID) | ORAL | Status: DC | PRN

## 2020-06-27 MED ORDER — MORPHINE SULFATE (PF) 2 MG/ML IV SOLN
1.0000 mg | INTRAVENOUS | Status: DC | PRN
  Administered 2020-06-27: 2 mg via INTRAVENOUS
  Filled 2020-06-27: qty 1

## 2020-06-27 NOTE — Care Management Obs Status (Signed)
MEDICARE OBSERVATION STATUS NOTIFICATION   Patient Details  Name: Patty Hughes MRN: [id number] Date of Birth: Oct 14, 1986   Medicare Observation Status Notification Given:  Yes    Darrold Span, RN 06/27/2020, 1:06 PM

## 2020-06-27 NOTE — Discharge Summary (Signed)
Physician Discharge Summary Patient ID: Patty Hughes MRN: [id number] DOB/AGE: 04-20-1986 34 y.o. Estimated body mass index is 37.61 kg/m as calculated from the following:   Height as of this encounter: 5\' 5"  (1.651 m).   Weight as of this encounter: 102.5 kg.   Admit date: 06/26/2020 Discharge date: 06/27/2020  Admission Diagnoses: traumatic subarachnoid hemorrhage closed head injury and post concussive syndrome  Discharge Diagnoses: same  Active Problems:   Subarachnoid bleed (HCC)   SDH (subdural hematoma) [facility])   Discharged Condition: good  Hospital Course:  Patient is admitted the hospital was observed underwent CTV and follow-up head CT that were all negative for progression she was observed overnight was ambulating voiding tolerating her diet with stable headache and no nausea.  Patient has been cleared with physical therapy and stable for discharge home with follow-up in 1 and half weeks. We will be discharging her with a head injury and concussion sheet and I extensively went over the but the importance of avoiding alcohol illicit drug use   Minimal television and electronic devices tablets her phones rest in no physical activity.  Consults:eks Significant Diagnostic Studies: Treatments: Discharge Exam: Blood pressure 110/61, pulse 81, temperature 97.8 F (36.6 C), temperature source Oral, resp. rate 13, height 5\' 5"  (1.651 m), weight 102.5 kg, last menstrual period 05/22/2020, SpO2 100 %.   Awake alert oriented strength 5/5  Disposition:  home   Allergies as of 06/27/2020     Reactions  Aripiprazole Other (See Comments)  restless  Haldol [haloperidol]   "Made me feel like a zombie"  Lurasidone Other (See Comments)  restless  Risperidone And Related Other (See Comments)  restless  Lamictal [lamotrigine] Rash   Medication List  TAKE these medications  clonazePAM 1 MG tablet Commonly known as: KLONOPIN Take 1 mg by mouth 3 (three) times daily as needed for  anxiety.  DULoxetine 30 MG capsule Commonly known as: CYMBALTA Take 30 mg by mouth in the morning, at noon, and at bedtime.  Fanapt 6 MG Tabs Generic drug: Iloperidone Take 12 mg by mouth at bedtime.  NuvaRing 0.12-0.015 MG/24HR vaginal ring Generic drug: etonogestrel-ethinyl estradiol Place 1 each vaginally every 28 (twenty-eight) days.  pantoprazole 40 MG tablet Commonly known as: PROTONIX Take 40 mg by mouth daily.  QUEtiapine 50 MG tablet Commonly known as: SEROQUEL Take 50 mg by mouth at bedtime.      Signed: 06/29/2020 06/27/2020, 12:14

## 2020-06-27 NOTE — Plan of Care (Signed)
  Problem: Education: Goal: Knowledge of General Education information will improve Description: Including pain rating scale, medication(s)/side effects and non-pharmacologic comfort measures Outcome: Adequate for Discharge   

## 2020-06-27 NOTE — Progress Notes (Signed)
Subjective: Patient reports Patient is somnolent but arousable complaining of headache little bit of nausea but otherwise nonfocal  Objective: Vital signs in last 24 hours: Temp:  [97.5 F (36.4 C)-98.1 F (36.7 C)] 97.8 F (36.6 C) (06/29 0740) Pulse Rate:  [71-100] 88 (06/29 0740) Resp:  [10-36] 14 (06/29 0740) BP: (109-138)/(68-89) 111/71 (06/29 0740) SpO2:  [93 %-100 %] 95 % (06/29 0740) Weight:  [102.5 kg] 102.5 kg (06/28 1032)  Intake/Output from previous day: 06/28 0701 - 06/29 0700 In: 1302.4 [I.V.:302.2; IV Piggyback:1000.1] Out: -  Intake/Output this shift: No intake/output data recorded.  Awakens to stem moves all extremities 5-5 strength pupils equal round and reactive to light  Lab Results: Recent Labs    06/26/20 1115  WBC 7.6  HGB 14.2  HCT 42.1  PLT 176   BMET Recent Labs    06/26/20 1115  NA 140  K 3.9  CL 105  CO2 24  GLUCOSE 133*  BUN <5*  CREATININE 0.76  CALCIUM 9.0    Studies/Results: CT HEAD WO CONTRAST  Result Date: 06/26/2020 CLINICAL DATA:  Follow-up examination for intracranial hemorrhage. EXAM: CT HEAD WITHOUT CONTRAST TECHNIQUE: Contiguous axial images were obtained from the base of the skull through the vertex without intravenous contrast. COMPARISON:  Prior CTs from earlier the same day. FINDINGS: Brain: Previously identified small volume acute subarachnoid hemorrhage involving the peripheral left temporal lobe again seen, not significantly changed from previous. Probable evolving surrounding cortical contusion without significant mass effect. Trace subdural hemorrhage noted along the left tentorium without associated mass effect. Overall, appearance is stable from previous. No new or interval acute intracranial hemorrhage. No acute large vessel territory infarct. No significant mass effect or midline shift. No hydrocephalus. Vascular: No hyperdense vessel. Skull: Evolving right posterior scalp contusion. Underlying calvarial fracture  again noted, stable. Sinuses/Orbits: Globes and orbital soft tissues within normal limits. Paranasal sinuses remain largely clear. Mastoid air cells and middle ear cavities are well pneumatized and free of fluid. Other: None. IMPRESSION: 1. No significant interval change in small volume acute subarachnoid hemorrhage involving the peripheral left temporal lobe. Probable small surrounding cortical contusion without significant mass effect. 2. Trace subdural hemorrhage along the left tentorium without associated mass effect, stable. 3. Evolving right posterior scalp contusion with underlying calvarial fracture, stable. 4. No other new acute intracranial abnormality. Electronically Signed   By: Rise Mu M.D.   On: 06/26/2020 23:08   CT Head Wo Contrast  Result Date: 06/26/2020 CLINICAL DATA:  Headache, posttraumatic. EXAM: CT HEAD WITHOUT CONTRAST CT CERVICAL SPINE WITHOUT CONTRAST TECHNIQUE: Multidetector CT imaging of the head and cervical spine was performed following the standard protocol without intravenous contrast. Multiplanar CT image reconstructions of the cervical spine were also generated. COMPARISON:  Report from head CT 06/25/2013 (images unavailable). FINDINGS: CT HEAD FINDINGS Brain: There is small volume acute subarachnoid hemorrhage along the inferolateral left temporal lobe. Subtle underlying left temporal lobe parenchymal contusion cannot be excluded. Additionally, there is trace acute subdural hemorrhage along the left tentorium (for instance as seen on series 4, image 42). No demarcated cortical infarct. No evidence of intracranial mass. No hydrocephalus or midline shift. Vascular: No hyperdense vessel. Skull: There is an acute, nondepressed fracture of the right occipital calvarium (for instance as seen on series 3, image 26). Sinuses/Orbits: Visualized orbits show no acute finding. Minimal ethmoid sinus mucosal thickening. No significant mastoid effusion. Other: Right  parietooccipital scalp hematoma. CT CERVICAL SPINE FINDINGS Alignment: Straightening of the expected cervical lordosis. No  significant spondylolisthesis. Cervical levocurvature, which may be positional. Skull base and vertebrae: The basion-dental and atlanto-dental intervals are maintained.No evidence of acute fracture to the cervical spine. Soft tissues and spinal canal: No prevertebral fluid or swelling. No visible canal hematoma. Disc levels: No significant bony spinal canal or neural foraminal narrowing at any level. Upper chest: No consolidation within the imaged lung apices. No visible pneumothorax. These results were called by telephone at the time of interpretation on 06/26/2020 at 11:31 am to provider [facility] , who verbally acknowledged these results. IMPRESSION: CT head: 1. Small volume acute subarachnoid hemorrhage overlying the inferolateral left temporal lobe. Subtle underlying left temporal lobe parenchymal contusion cannot be excluded. 2. Trace acute subdural hemorrhage layering along left tentorium. 3. Nondepressed fracture of the right occipital calvarium. Consider CT venography to assess for injury to the right transverse dural venous sinus. 4. Overlying right parietooccipital scalp hematoma. 5. Minimal ethmoid sinus mucosal thickening. CT cervical spine: No evidence of acute fracture to the cervical spine. Electronically Signed   By: Jackey Loge DO   On: 06/26/2020 11:31   CT Cervical Spine Wo Contrast  Result Date: 06/26/2020 CLINICAL DATA:  Headache, posttraumatic. EXAM: CT HEAD WITHOUT CONTRAST CT CERVICAL SPINE WITHOUT CONTRAST TECHNIQUE: Multidetector CT imaging of the head and cervical spine was performed following the standard protocol without intravenous contrast. Multiplanar CT image reconstructions of the cervical spine were also generated. COMPARISON:  Report from head CT 06/25/2013 (images unavailable). FINDINGS: CT HEAD FINDINGS Brain: There is small volume acute  subarachnoid hemorrhage along the inferolateral left temporal lobe. Subtle underlying left temporal lobe parenchymal contusion cannot be excluded. Additionally, there is trace acute subdural hemorrhage along the left tentorium (for instance as seen on series 4, image 42). No demarcated cortical infarct. No evidence of intracranial mass. No hydrocephalus or midline shift. Vascular: No hyperdense vessel. Skull: There is an acute, nondepressed fracture of the right occipital calvarium (for instance as seen on series 3, image 26). Sinuses/Orbits: Visualized orbits show no acute finding. Minimal ethmoid sinus mucosal thickening. No significant mastoid effusion. Other: Right parietooccipital scalp hematoma. CT CERVICAL SPINE FINDINGS Alignment: Straightening of the expected cervical lordosis. No significant spondylolisthesis. Cervical levocurvature, which may be positional. Skull base and vertebrae: The basion-dental and atlanto-dental intervals are maintained.No evidence of acute fracture to the cervical spine. Soft tissues and spinal canal: No prevertebral fluid or swelling. No visible canal hematoma. Disc levels: No significant bony spinal canal or neural foraminal narrowing at any level. Upper chest: No consolidation within the imaged lung apices. No visible pneumothorax. These results were called by telephone at the time of interpretation on 06/26/2020 at 11:31 am to provider [facility] , who verbally acknowledged these results. IMPRESSION: CT head: 1. Small volume acute subarachnoid hemorrhage overlying the inferolateral left temporal lobe. Subtle underlying left temporal lobe parenchymal contusion cannot be excluded. 2. Trace acute subdural hemorrhage layering along left tentorium. 3. Nondepressed fracture of the right occipital calvarium. Consider CT venography to assess for injury to the right transverse dural venous sinus. 4. Overlying right parietooccipital scalp hematoma. 5. Minimal ethmoid sinus mucosal  thickening. CT cervical spine: No evidence of acute fracture to the cervical spine. Electronically Signed   By: Jackey Loge DO   On: 06/26/2020 11:31   CT VENOGRAM HEAD  Result Date: 06/26/2020 CLINICAL DATA:  Right occipital fracture EXAM: CT VENOGRAM HEAD TECHNIQUE: Contiguous axial images were obtained from the vertex to skull base after the administration of intravenous contrast. Coronal and  sagittal reformats were obtained. Maximum intensity projection images were created for better evaluation the veins. CONTRAST:  OMNIPAQUE IOHEXOL 300 MG/ML  SOLN COMPARISON:  None. FINDINGS: Superior sagittal sinus, straight sinus, vein of Galen, and internal cerebral veins are patent. Transverse and sigmoid sinuses are patent. The right transverse sinus is dominant. Right occipital scalp soft tissue swelling. Nondisplaced right occipital calvarium fracture. IMPRESSION: No evidence of dural venous sinus injury. Electronically Signed   By: Guadlupe Spanish M.D.   On: 06/26/2020 12:08    Assessment/Plan: Postop day 1 close head injury postconcussive syndrome traumatic subarachnoid hemorrhage small skim subdural stable on follow-up CT scans will slowly mobilize patient when the patient is able to be mobilized ambulating tolerating p.o. we will consider discharge.  LOS: 1 day     Patty Hughes 06/27/2020, 8:20 AM

## 2020-06-27 NOTE — Progress Notes (Signed)
Concusion information and pamphlet given to the pt to read

## 2020-06-27 NOTE — Progress Notes (Signed)
Discharge:  Pt d/c from room via wheelchair, Family member with the pt.  Discharge instructions given to the patient and family members.  No questions from pt,   Pt dressed in street clothes and left with discharge papers and prescriptions  in hand.  IV d/ced, tele removed and no complaints of pain or discomfort. 

## 2020-06-27 NOTE — Evaluation (Signed)
Physical Therapy Evaluation Patient Details Name: Patty Hughes MRN: [id number] DOB: Aug 13, 1986 Today's Date: 06/27/2020   History of Present Illness  34 year old female presented to the ED today after sustaining a fall last night. CR revealed small acute SAH over L temporal lobe and trace SDH along the left tenorium with nodepressed fx of R occipital calvarioum. PT reports "I drink a lot". PMH: bipolar 1 disorders, borderline personality disorder, PTSD, schizoaffective disorder    Clinical Impression  Pt admitted with above. Pt eager to leave. Pt functioning at supervision level due to mild instability. Pt given handout on concussions and how to manage and what to do if symptoms worsen. Pt reports understanding. Pt scored 21/23 on DGI indicating minimal falls risk. Pt states "I"m not going to drink no more but I can't quite smoking." Pt with inconsistent report of home set up, who she lives with and support system. Acute PT to cont to monitor patient while in hospital.    Follow Up Recommendations No PT follow up;Supervision/Assistance - 24 hour (initially then intermittent)    Equipment Recommendations  None recommended by PT    Recommendations for Other Services       Precautions / Restrictions Precautions Precautions: Fall Precaution Comments: post-concussive Restrictions Weight Bearing Restrictions: No      Mobility  Bed Mobility Overal bed mobility: Independent             General bed mobility comments: no physical assist needed, HOB flat  Transfers Overall transfer level: Modified independent Equipment used: None             General transfer comment: pt milldy unsteady due to mild dizziness upon standing however reports it's much better now, pt stands with wide base of support and gathers herself, no assist needed  Ambulation/Gait Ambulation/Gait assistance: Min guard Gait Distance (Feet): 400 Feet Assistive device: None Gait Pattern/deviations:  Step-through pattern;Drifts right/left Gait velocity: wfl Gait velocity interpretation: >2.62 ft/sec, indicative of community ambulatory General Gait Details: pt mildly unsteady drifting L/R but no overt episode of LOB  Stairs Stairs: Yes Stairs assistance: Min guard Stair Management: One rail Right;Alternating pattern Number of Stairs: 12 General stair comments: no episode of LOB  Wheelchair Mobility    Modified Rankin (Stroke Patients Only) Modified Rankin (Stroke Patients Only) Pre-Morbid Rankin Score: No symptoms Modified Rankin: No significant disability     Balance Overall balance assessment: Mild deficits observed, not formally tested                               Standardized Balance Assessment Standardized Balance Assessment : Dynamic Gait Index   Dynamic Gait Index Level Surface: Normal Change in Gait Speed: Normal Gait with Horizontal Head Turns: Mild Impairment Gait with Vertical Head Turns: Mild Impairment Gait and Pivot Turn: Normal Step Over Obstacle: Normal Step Around Obstacles: Normal Steps: Mild Impairment Total Score: 21       Pertinent Vitals/Pain Pain Assessment: 0-10 Pain Score: 3  Pain Location: headache Pain Descriptors / Indicators: Headache Pain Intervention(s): Monitored during session    Home Living Family/patient expects to be discharged to:: Private residence Living Arrangements: Spouse/significant other Available Help at Discharge: Friend(s);Available PRN/intermittently Type of Home: House Home Access: Level entry     Home Layout: One level Home Equipment: None Additional Comments: pt with inconsistent report stating she lives with her boyfriend but they haven't been getting alone so her friend who she likes better  and is "kind of like my ex" is going to take her home and care for her. 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     Hand Dominance   Dominant Hand: Right    Extremity/Trunk Assessment   Upper Extremity Assessment Upper Extremity Assessment: Overall WFL for tasks assessed    Lower Extremity Assessment Lower Extremity Assessment: Overall WFL for tasks assessed    Cervical / Trunk Assessment Cervical / Trunk Assessment: Other exceptions Cervical / Trunk Exceptions: fractured occipital bone  Communication   Communication: No difficulties  Cognition Arousal/Alertness: Awake/alert Behavior During Therapy: Anxious Overall Cognitive Status: Within Functional Limits for tasks assessed                                 General Comments: suspect pt is at baseline cognition with her noted psych history. pt mildly anxious regarding going home however cooperative and pleasant with me      General Comments General comments (skin integrity, edema, etc.): Pt educated on conussion and limited activities and when to call her MD. Hand out provided. VSS    Exercises     Assessment/Plan    PT Assessment Patient needs continued PT services  PT Problem List Decreased strength;Decreased range of motion;Decreased activity tolerance;Decreased balance;Decreased mobility;Decreased cognition       PT Treatment Interventions Gait training;Stair training;Functional mobility training;Therapeutic activities;Therapeutic exercise;Neuromuscular re-education    PT Goals (Current goals can be found in the Care Plan section)  Acute Rehab PT Goals Patient Stated Goal: home today PT Goal Formulation: With patient Time For Goal Achievement: 07/04/20 Potential to Achieve Goals: Good Additional Goals Additional Goal #1: Pt to score >41 on Berg Balance test to indicate low falls risk.    Frequency Min 3X/week   Barriers to discharge Decreased caregiver  support unsure of support    Co-evaluation               AM-PAC PT "6 Clicks" Mobility  Outcome Measure Help needed turning from your back to your side while in a flat bed without using bedrails?: None Help needed moving from lying on your back to sitting on the side of a flat bed without using bedrails?: None Help needed moving to and from a bed to a chair (including a wheelchair)?: None Help needed standing up from a chair using your arms (e.g., wheelchair or bedside chair)?: None Help needed to walk in hospital room?: A Little Help needed climbing 3-5 steps with a railing? : A Little 6 Click Score: 22    End of Session Equipment Utilized During Treatment: Gait belt Activity Tolerance: Patient tolerated treatment well Patient left: in bed;with call bell/phone within reach Nurse Communication: Mobility status PT Visit Diagnosis: Unsteadiness on feet (R26.81)    Time: 1287-8676 PT Time Calculation (min) (ACUTE ONLY): 21 min   Charges:   PT Evaluation $PT Eval Low Complexity: 1 Low          Lewis Shock, PT, DPT Acute Rehabilitation Services Pager #: [phone] Office #: [phone]   Iona Hansen 06/27/2020, 11:49 AM

## 2020-06-27 NOTE — Care Management CC44 (Signed)
Condition Code 44 Documentation Completed  Patient Details  Name: TASHANA HABERL MRN: [id number] Date of Birth: 04/30/1986   Condition Code 44 given:  Yes Patient signature on Condition Code 44 notice:  Yes Documentation of 2 MD's agreement:  Yes Code 44 added to claim:  Yes    Darrold Span, RN 06/27/2020, 1:06 PM

## 2020-06-27 NOTE — Evaluation (Signed)
Occupational Therapy Evaluation Patient Details Name: Patty Hughes MRN: [id number] DOB: Jul 03, 1986 Today's Date: 06/27/2020    History of Present Illness 34 year old female presented to the ED today after sustaining a fall last night. CR revealed small acute SAH over L temporal lobe and trace SDH along the left tenorium with nodepressed fx of R occipital calvarioum. PT reports "I drink a lot". PMH: bipolar 1 disorders, borderline personality disorder, PTSD, schizoaffective disorder   Clinical Impression   PTA, pt was living at home, pt reports she was independent with ADL/IADL and functional mobility. Pt currently requires supervision for ADL completion and functional mobility. Educated pt on post concussion symptom management and assisted her with reducing blue light on her cell phone. Educated pt on when to contact her MD, importance of eliminating EtoH, pt stated "I can definitely do that" (not drinking alcohol), pt then asking if she would be able to continue to smoke marijuana. Advised pt to discuss these concerns with MD. Pt with anticipated d/c this date. Recommend pt has 24/7 physical assistance initially. Will continue to follow acutely should she remain in acute care.     Follow Up Recommendations  No OT follow up    Equipment Recommendations  None recommended by OT    Recommendations for Other Services       Precautions / Restrictions Precautions Precautions: Fall Precaution Comments: post-concussive Restrictions Weight Bearing Restrictions: No      Mobility Bed Mobility Overal bed mobility: Independent             General bed mobility comments: no physical assist needed, HOB flat  Transfers Overall transfer level: Modified independent Equipment used: None             General transfer comment: pt milldy unsteady due to mild dizziness upon standing however reports it's much better now, pt stands with wide base of support and gathers herself, no assist  needed    Balance Overall balance assessment: Mild deficits observed, not formally tested                               Standardized Balance Assessment Standardized Balance Assessment : Dynamic Gait Index   Dynamic Gait Index Level Surface: Normal Change in Gait Speed: Normal Gait with Horizontal Head Turns: Mild Impairment Gait with Vertical Head Turns: Mild Impairment Gait and Pivot Turn: Normal Step Over Obstacle: Normal Step Around Obstacles: Normal Steps: Mild Impairment Total Score: 21     ADL either performed or assessed with clinical judgement   ADL Overall ADL's : Needs assistance/impaired                                     Functional mobility during ADLs: Supervision/safety General ADL Comments: pt requires supervision for all ADL and Supervision for functional mobility without AD     Vision Baseline Vision/History: No visual deficits Patient Visual Report: No change from baseline Vision Assessment?: Yes Eye Alignment: Within Functional Limits Ocular Range of Motion: Within Functional Limits Alignment/Gaze Preference: Within Defined Limits Tracking/Visual Pursuits: Able to track stimulus in all quads without difficulty Saccades: Decreased speed of saccadic movement Convergence: Within functional limits Visual Fields: No apparent deficits Additional Comments: pt reporting eye fatigue during assessment;     Perception     Praxis      Pertinent Vitals/Pain Pain Assessment: 0-10 Pain Score:  3  Pain Location: headache Pain Descriptors / Indicators: Headache Pain Intervention(s): Monitored during session;Limited activity within patient's tolerance     Hand Dominance Right   Extremity/Trunk Assessment Upper Extremity Assessment Upper Extremity Assessment: Overall WFL for tasks assessed   Lower Extremity Assessment Lower Extremity Assessment: Overall WFL for tasks assessed   Cervical / Trunk Assessment Cervical / Trunk  Assessment: Other exceptions Cervical / Trunk Exceptions: fractured occipital bone   Communication Communication Communication: No difficulties   Cognition Arousal/Alertness: Awake/alert Behavior During Therapy: Anxious Overall Cognitive Status: Within Functional Limits for tasks assessed                                 General Comments: suspect pt is at baseline cognition with her noted psych history. pt mildly anxious regarding going home however cooperative and pleasant with me;oriented x4,   General Comments  pt educated on post consussive precautions (limiting screen time, activity progression, limit EtOh intake, and when to contact MD. PT provided handout in previous session;VSS throughout     Exercises     Shoulder Instructions      Home Living Family/patient expects to be discharged to:: Private residence Living Arrangements: Spouse/significant other Available Help at Discharge: Friend(s);Available PRN/intermittently Type of Home: House Home Access: Level entry     Home Layout: One level     Bathroom Shower/Tub: Producer, television/film/video: Standard     Home Equipment: None   Additional Comments: pt reporting she will be staying with her boyfirend, this report differs slightly from report to PT      Prior Functioning/Environment Level of Independence: Independent        Comments: 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 on 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, 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        OT Problem List: Decreased safety awareness;Decreased activity tolerance;Pain      OT Treatment/Interventions:      OT Goals(Current goals can be found in the care plan section) Acute Rehab OT Goals Patient Stated Goal: home today OT Goal Formulation: With patient Time For Goal Achievement: 07/11/20 Potential to Achieve Goals: Good  OT Frequency:     Barriers to D/C:            Co-evaluation               AM-PAC OT "6 Clicks" Daily Activity     Outcome Measure Help from another person eating meals?: None Help from another person taking care of personal grooming?: A Little Help from another person toileting, which includes using toliet, bedpan, or urinal?: A Little Help from another person bathing (including washing, rinsing, drying)?: A Little Help from another person to put on and taking off regular upper body clothing?: None Help from another person to put on and taking off regular lower body clothing?: A Little 6 Click Score: 20   End of Session Nurse Communication: Mobility status  Activity Tolerance: Patient tolerated treatment well Patient left: in bed;with call bell/phone within reach  OT Visit Diagnosis: Pain Pain - part of body:  (headache)                Time: 1607-3710 OT Time Calculation (min): 15 min Charges:  OT General Charges $OT Visit: 1 Visit OT Evaluation $OT Eval Moderate Complexity: 1 Mod  Dhruvi Crenshaw OTR/L Acute Rehabilitation Services Office: [phone]   Rebeca Alert 06/27/2020, 12:44  PM

## 2022-06-03 IMAGING — CT CT HEAD W/O CM
4 series · 16 of 47 positions shown, 18 images · non-contrast
Comparison: Prior CTs from earlier the same day.

CLINICAL DATA: Follow-up examination for intracranial hemorrhage.

EXAM:
CT HEAD WITHOUT CONTRAST
TECHNIQUE: Contiguous axial images were obtained from the base of the skull
through the vertex without intravenous contrast.

[Series 3: head wo · axial · 0.48mm/px · z∈[-172,-52]mm · 7 of 33 slices shown, 9 images]
[im 5/33  brain]
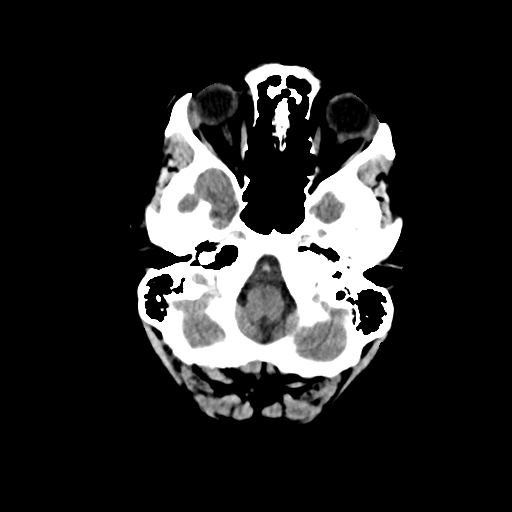
[im 5/33  bone]
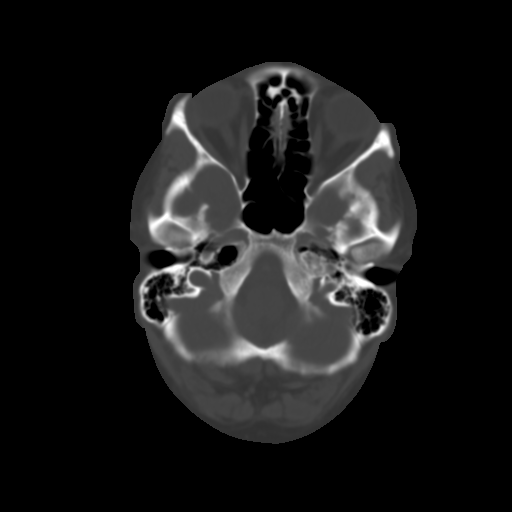
[im 9/33  brain]
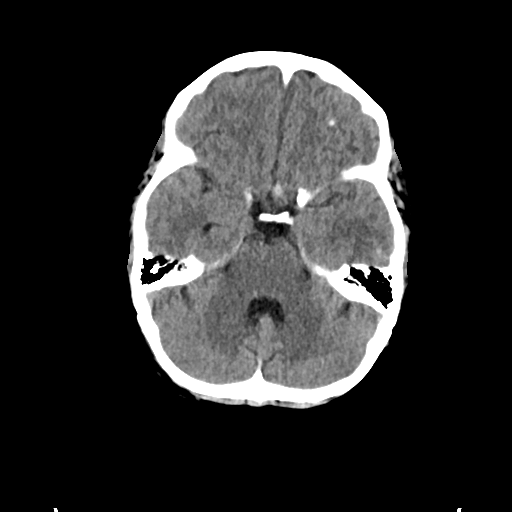
[im 13/33  brain]
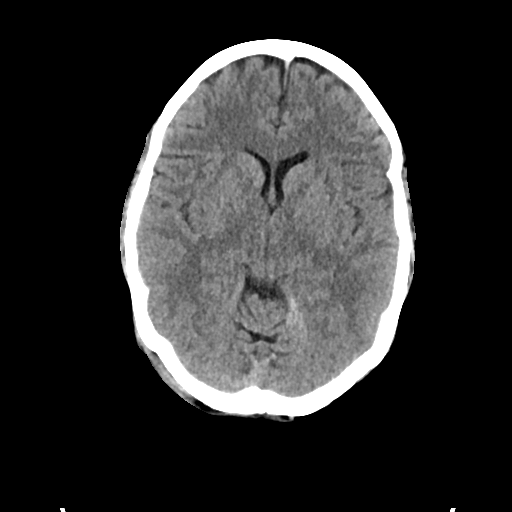
[im 17/33  brain]
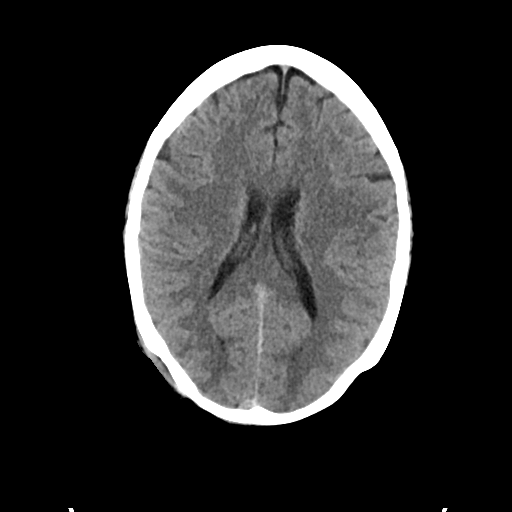
[im 21/33  brain]
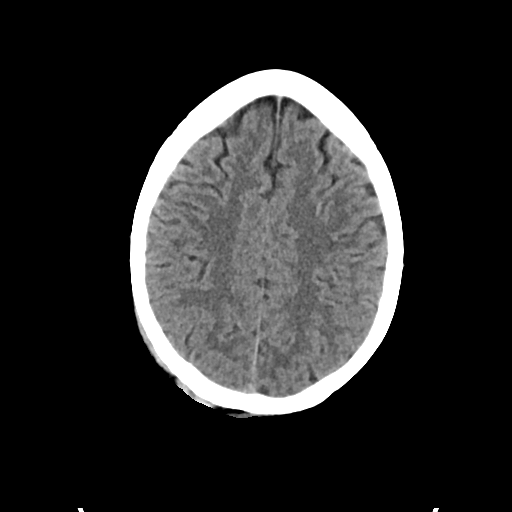
[im 21/33  bone]
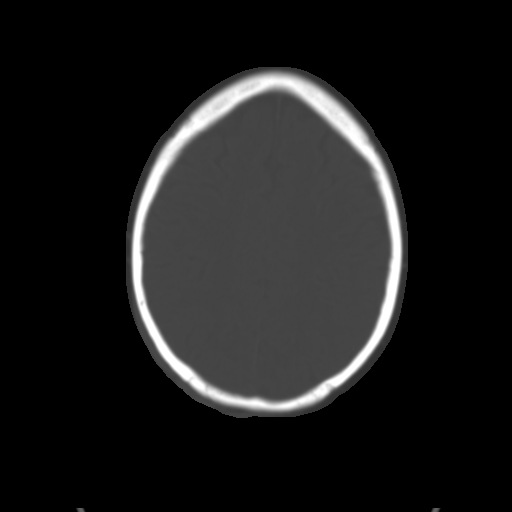
[im 25/33  brain]
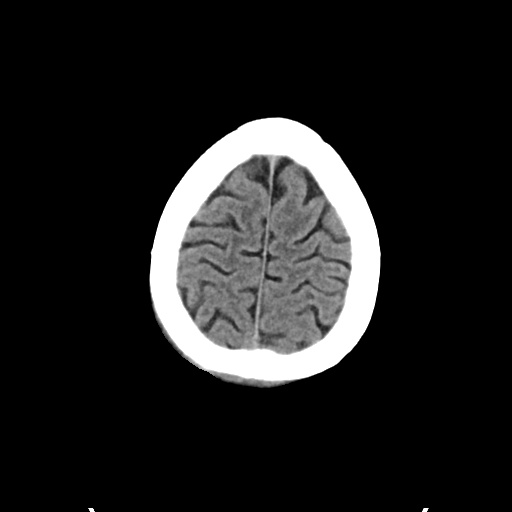
[im 29/33  brain]
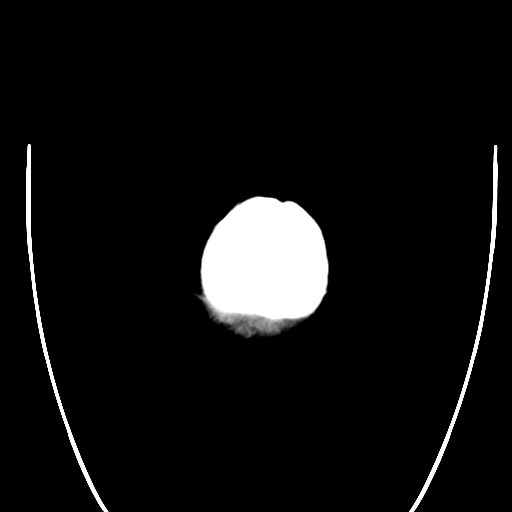

[Series 4: head bone · axial · 0.48mm/px · z∈[-176,-144]mm · 3 of 83 slices shown]
[im 9/83  bone]
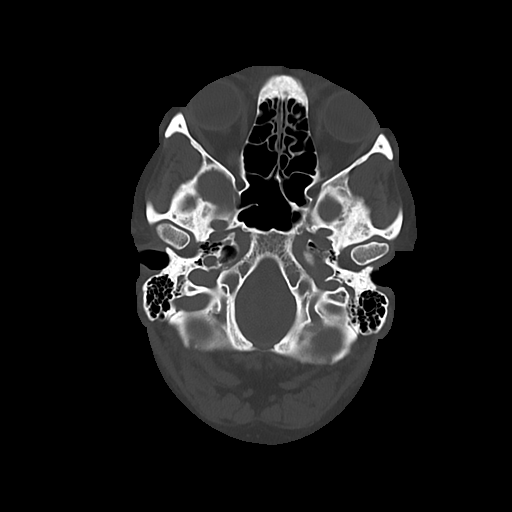
[im 17/83  bone]
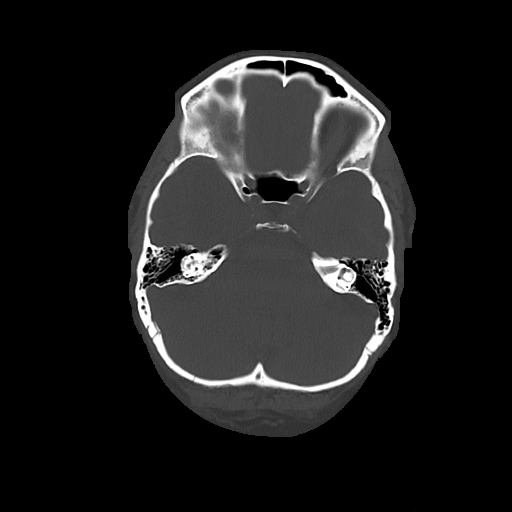
[im 25/83  bone]
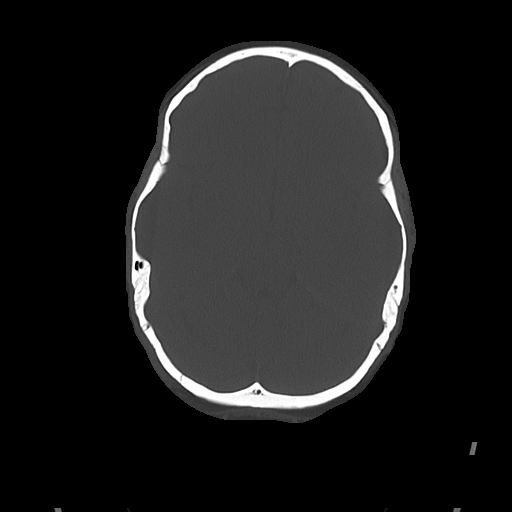

[Series 5: cor soft · coronal · 0.33mm/px · 3 of 73 slices shown]
[im 25/73  brain]
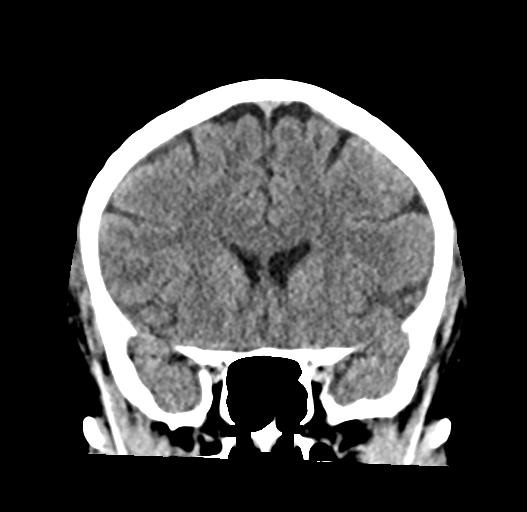
[im 33/73  brain]
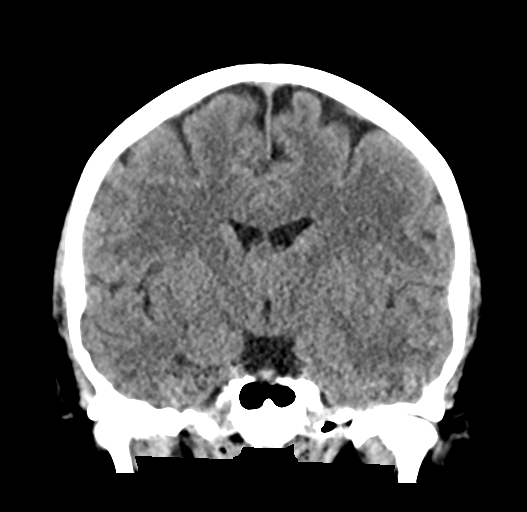
[im 41/73  brain]
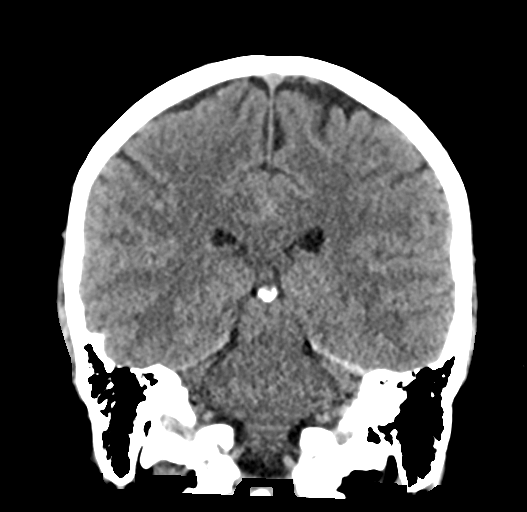

[Series 6: sag soft · sagittal · 0.36mm/px · 3 of 59 slices shown]
[im 20/59  brain]
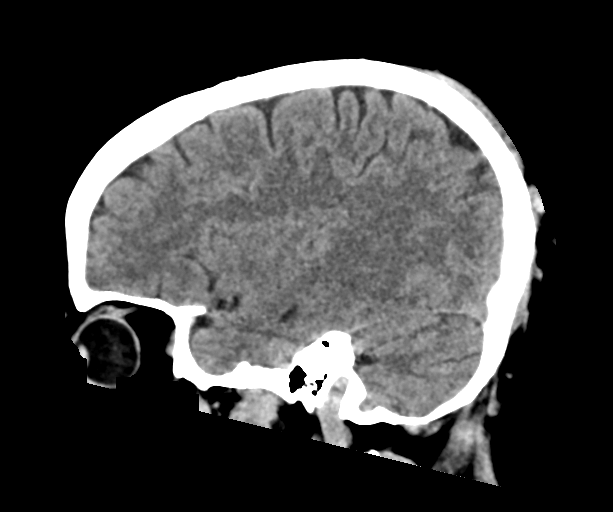
[im 30/59  brain]
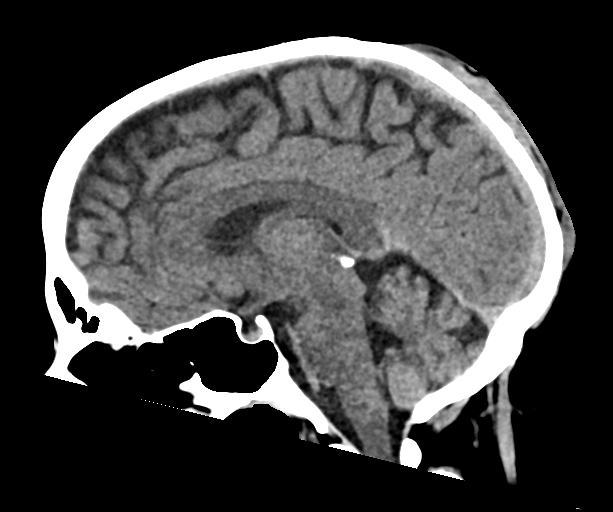
[im 39/59  brain]
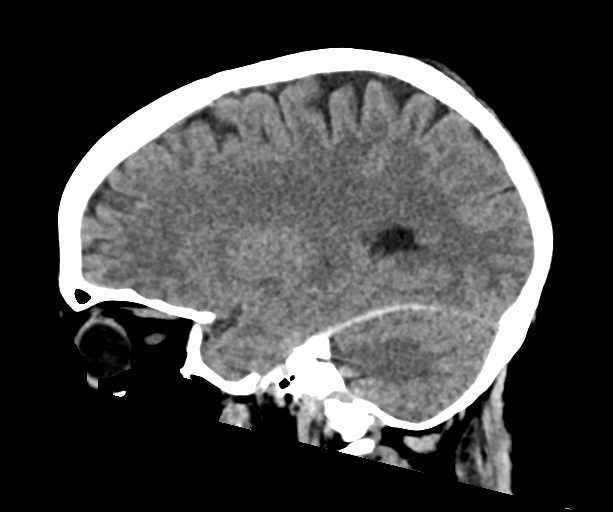

[16 of 47 positions shown; findings below may reference images not displayed]

FINDINGS: Brain: Previously identified small volume acute subarachnoid
hemorrhage involving the peripheral left temporal lobe again seen,
not significantly changed from previous. Probable evolving
surrounding cortical contusion without significant mass effect.
Trace subdural hemorrhage noted along the left tentorium without
associated mass effect. Overall, appearance is stable from previous.
No new or interval acute intracranial hemorrhage. No acute large
vessel territory infarct. No significant mass effect or midline
shift. No hydrocephalus.

Vascular: No hyperdense vessel.

Skull: Evolving right posterior scalp contusion. Underlying
calvarial fracture again noted, stable.

Sinuses/Orbits: Globes and orbital soft tissues within normal
limits. Paranasal sinuses remain largely clear. Mastoid air cells
and middle ear cavities are well pneumatized and free of fluid.

Other: None.
IMPRESSION: 1. No significant interval change in small volume acute subarachnoid
hemorrhage involving the peripheral left temporal lobe. Probable
small surrounding cortical contusion without significant mass
effect.
2. Trace subdural hemorrhage along the left tentorium without
associated mass effect, stable.
3. Evolving right posterior scalp contusion with underlying
calvarial fracture, stable.
4. No other new acute intracranial abnormality.
